# Patient Record
Sex: Female | Born: 1945 | Race: Black or African American | Hispanic: No | Smoking: Never smoker
Health system: Southern US, Community
[De-identification: ages and names within clinical notes are randomized; demographics above are authoritative.]

## PROBLEM LIST (undated history)

## (undated) DIAGNOSIS — J45909 Unspecified asthma, uncomplicated: Secondary | ICD-10-CM

## (undated) DIAGNOSIS — I509 Heart failure, unspecified: Secondary | ICD-10-CM

## (undated) HISTORY — PX: CHOLECYSTECTOMY: SHX55

## (undated) HISTORY — PX: OTHER SURGICAL HISTORY: SHX169

## (undated) HISTORY — PX: HERNIA REPAIR: SHX51

---

## 2015-07-12 ENCOUNTER — Emergency Department (HOSPITAL_COMMUNITY): Payer: Medicare Other

## 2015-07-12 ENCOUNTER — Encounter (HOSPITAL_COMMUNITY): Payer: Self-pay | Admitting: Emergency Medicine

## 2015-07-12 ENCOUNTER — Inpatient Hospital Stay (HOSPITAL_COMMUNITY)
Admission: EM | Admit: 2015-07-12 | Discharge: 2015-07-16 | DRG: 193 | Disposition: A | Discharge status: Home / Self Care | Payer: Medicare Other | Attending: Internal Medicine | Admitting: Internal Medicine

## 2015-07-12 DIAGNOSIS — R0902 Hypoxemia: Secondary | ICD-10-CM

## 2015-07-12 DIAGNOSIS — J101 Influenza due to other identified influenza virus with other respiratory manifestations: Secondary | ICD-10-CM | POA: Diagnosis present

## 2015-07-12 DIAGNOSIS — Z6841 Body Mass Index (BMI) 40.0 and over, adult: Secondary | ICD-10-CM

## 2015-07-12 DIAGNOSIS — I509 Heart failure, unspecified: Secondary | ICD-10-CM | POA: Diagnosis present

## 2015-07-12 DIAGNOSIS — Z86718 Personal history of other venous thrombosis and embolism: Secondary | ICD-10-CM

## 2015-07-12 DIAGNOSIS — J1100 Influenza due to unidentified influenza virus with unspecified type of pneumonia: Principal | ICD-10-CM | POA: Diagnosis present

## 2015-07-12 DIAGNOSIS — Z881 Allergy status to other antibiotic agents status: Secondary | ICD-10-CM | POA: Diagnosis not present

## 2015-07-12 DIAGNOSIS — Z7901 Long term (current) use of anticoagulants: Secondary | ICD-10-CM

## 2015-07-12 DIAGNOSIS — J9601 Acute respiratory failure with hypoxia: Secondary | ICD-10-CM | POA: Diagnosis present

## 2015-07-12 DIAGNOSIS — J45909 Unspecified asthma, uncomplicated: Secondary | ICD-10-CM | POA: Diagnosis present

## 2015-07-12 DIAGNOSIS — R8290 Unspecified abnormal findings in urine: Secondary | ICD-10-CM | POA: Diagnosis present

## 2015-07-12 DIAGNOSIS — R739 Hyperglycemia, unspecified: Secondary | ICD-10-CM | POA: Diagnosis present

## 2015-07-12 DIAGNOSIS — Z888 Allergy status to other drugs, medicaments and biological substances status: Secondary | ICD-10-CM | POA: Diagnosis not present

## 2015-07-12 DIAGNOSIS — R69 Illness, unspecified: Secondary | ICD-10-CM | POA: Diagnosis present

## 2015-07-12 DIAGNOSIS — Z8679 Personal history of other diseases of the circulatory system: Secondary | ICD-10-CM | POA: Diagnosis not present

## 2015-07-12 DIAGNOSIS — J9621 Acute and chronic respiratory failure with hypoxia: Secondary | ICD-10-CM | POA: Diagnosis present

## 2015-07-12 HISTORY — DX: Heart failure, unspecified: I50.9

## 2015-07-12 HISTORY — DX: Unspecified asthma, uncomplicated: J45.909

## 2015-07-12 LAB — I-STAT ARTERIAL BLOOD GAS, ED
Acid-Base Excess: 1 mmol/L (ref 0.0–2.0)
BICARBONATE: 26.6 meq/L — AB (ref 20.0–24.0)
O2 Saturation: 97 %
PH ART: 7.402 (ref 7.350–7.450)
PO2 ART: 86 mmHg (ref 80.0–100.0)
TCO2: 28 mmol/L (ref 0–100)
pCO2 arterial: 42.7 mmHg (ref 35.0–45.0)

## 2015-07-12 LAB — URINALYSIS, ROUTINE W REFLEX MICROSCOPIC
Bilirubin Urine: NEGATIVE
Glucose, UA: 500 mg/dL — AB
Ketones, ur: 15 mg/dL — AB
LEUKOCYTES UA: NEGATIVE
NITRITE: NEGATIVE
PH: 5 (ref 5.0–8.0)
Protein, ur: NEGATIVE mg/dL
SPECIFIC GRAVITY, URINE: 1.021 (ref 1.005–1.030)

## 2015-07-12 LAB — DIFFERENTIAL
BASOS ABS: 0.1 10*3/uL (ref 0.0–0.1)
BASOS PCT: 1 %
Eosinophils Absolute: 0 10*3/uL (ref 0.0–0.7)
Eosinophils Relative: 0 %
LYMPHS PCT: 9 %
Lymphs Abs: 1.5 10*3/uL (ref 0.7–4.0)
MONO ABS: 1 10*3/uL (ref 0.1–1.0)
MONOS PCT: 6 %
NEUTROS ABS: 14.1 10*3/uL — AB (ref 1.7–7.7)
Neutrophils Relative %: 84 %

## 2015-07-12 LAB — CBC
HCT: 41.1 % (ref 36.0–46.0)
Hemoglobin: 13.5 g/dL (ref 12.0–15.0)
MCH: 26.9 pg (ref 26.0–34.0)
MCHC: 32.8 g/dL (ref 30.0–36.0)
MCV: 82 fL (ref 78.0–100.0)
PLATELETS: 175 10*3/uL (ref 150–400)
RBC: 5.01 MIL/uL (ref 3.87–5.11)
RDW: 15.9 % — ABNORMAL HIGH (ref 11.5–15.5)
WBC: 16 10*3/uL — AB (ref 4.0–10.5)

## 2015-07-12 LAB — URINE MICROSCOPIC-ADD ON

## 2015-07-12 LAB — HEPATIC FUNCTION PANEL
ALBUMIN: 2.9 g/dL — AB (ref 3.5–5.0)
ALK PHOS: 96 U/L (ref 38–126)
ALT: 17 U/L (ref 14–54)
AST: 21 U/L (ref 15–41)
BILIRUBIN TOTAL: 0.8 mg/dL (ref 0.3–1.2)
Bilirubin, Direct: 0.2 mg/dL (ref 0.1–0.5)
Indirect Bilirubin: 0.6 mg/dL (ref 0.3–0.9)
Total Protein: 6.4 g/dL — ABNORMAL LOW (ref 6.5–8.1)

## 2015-07-12 LAB — BASIC METABOLIC PANEL
Anion gap: 12 (ref 5–15)
BUN: 10 mg/dL (ref 6–20)
CALCIUM: 8.9 mg/dL (ref 8.9–10.3)
CHLORIDE: 100 mmol/L — AB (ref 101–111)
CO2: 26 mmol/L (ref 22–32)
CREATININE: 0.85 mg/dL (ref 0.44–1.00)
Glucose, Bld: 270 mg/dL — ABNORMAL HIGH (ref 65–99)
Potassium: 3.9 mmol/L (ref 3.5–5.1)
SODIUM: 138 mmol/L (ref 135–145)

## 2015-07-12 LAB — LIPASE, BLOOD: LIPASE: 21 U/L (ref 11–51)

## 2015-07-12 LAB — TROPONIN I: Troponin I: <0.03 ng/mL (ref <0.031)

## 2015-07-12 LAB — INFLUENZA PANEL BY PCR (TYPE A & B)
H1N1 flu by pcr: NOT DETECTED
INFLBPCR: NEGATIVE
Influenza A By PCR: POSITIVE — AB

## 2015-07-12 LAB — I-STAT TROPONIN, ED: TROPONIN I, POC: 0 ng/mL (ref 0.00–0.08)

## 2015-07-12 LAB — PROTIME-INR
INR: 1.94 — AB (ref 0.00–1.49)
PROTHROMBIN TIME: 22.1 s — AB (ref 11.6–15.2)

## 2015-07-12 LAB — BRAIN NATRIURETIC PEPTIDE: B Natriuretic Peptide: 53.9 pg/mL (ref 0.0–100.0)

## 2015-07-12 LAB — GLUCOSE, CAPILLARY
GLUCOSE-CAPILLARY: 232 mg/dL — AB (ref 65–99)
GLUCOSE-CAPILLARY: 230 mg/dL — AB (ref 65–99)
GLUCOSE-CAPILLARY: 229 mg/dL — AB (ref 65–99)

## 2015-07-12 LAB — STREP PNEUMONIAE URINARY ANTIGEN: Strep Pneumo Urinary Antigen: NEGATIVE

## 2015-07-12 LAB — I-STAT CG4 LACTIC ACID, ED: LACTIC ACID, VENOUS: 1.82 mmol/L (ref 0.5–2.0)

## 2015-07-12 MED ORDER — SODIUM CHLORIDE 0.9 % IV BOLUS (SEPSIS): 500.0000 mL | INTRAVENOUS | Status: AC

## 2015-07-12 MED ORDER — DEXTROSE 5 % IV SOLN
500.0000 mg | Freq: Once | INTRAVENOUS | Status: AC
  Administered 2015-07-12: 500 mg via INTRAVENOUS
  Filled 2015-07-12: qty 500

## 2015-07-12 MED ORDER — MAGNESIUM SULFATE 2 GM/50ML IV SOLN: 2.0000 g | Freq: Once | INTRAVENOUS | Status: DC

## 2015-07-12 MED ORDER — ALBUTEROL (5 MG/ML) CONTINUOUS INHALATION SOLN: 10.0000 mg/h | INHALATION_SOLUTION | RESPIRATORY_TRACT | Status: DC

## 2015-07-12 MED ORDER — IPRATROPIUM BROMIDE 0.02 % IN SOLN: 1.0000 mg | Freq: Once | RESPIRATORY_TRACT | Status: DC

## 2015-07-12 MED ORDER — LEVETIRACETAM ER 500 MG PO TB24
1500.0000 mg | ORAL_TABLET | Freq: Every day | ORAL | Status: DC
  Administered 2015-07-12 – 2015-07-15 (×4): 1500 mg via ORAL
  Filled 2015-07-12 (×5): qty 3

## 2015-07-12 MED ORDER — IPRATROPIUM-ALBUTEROL 0.5-2.5 (3) MG/3ML IN SOLN
3.0000 mL | Freq: Once | RESPIRATORY_TRACT | Status: AC
  Administered 2015-07-12: 3 mL via RESPIRATORY_TRACT
  Filled 2015-07-12: qty 3

## 2015-07-12 MED ORDER — METHYLPREDNISOLONE SODIUM SUCC 125 MG IJ SOLR: 125.0000 mg | Freq: Once | INTRAMUSCULAR | Status: DC

## 2015-07-12 MED ORDER — SODIUM CHLORIDE 0.9 % IV SOLN
INTRAVENOUS | Status: DC
  Administered 2015-07-12: 17:00:00 via INTRAVENOUS

## 2015-07-12 MED ORDER — IPRATROPIUM-ALBUTEROL 0.5-2.5 (3) MG/3ML IN SOLN
3.0000 mL | RESPIRATORY_TRACT | Status: DC
  Administered 2015-07-12 – 2015-07-14 (×15): 3 mL via RESPIRATORY_TRACT
  Filled 2015-07-12 (×15): qty 3

## 2015-07-12 MED ORDER — AZITHROMYCIN 500 MG IV SOLR
500.0000 mg | INTRAVENOUS | Status: AC
  Administered 2015-07-13 – 2015-07-16 (×4): 500 mg via INTRAVENOUS
  Filled 2015-07-12 (×4): qty 500

## 2015-07-12 MED ORDER — IPRATROPIUM-ALBUTEROL 0.5-2.5 (3) MG/3ML IN SOLN: 3.0000 mL | Freq: Four times a day (QID) | RESPIRATORY_TRACT | Status: DC

## 2015-07-12 MED ORDER — OSELTAMIVIR PHOSPHATE 75 MG PO CAPS
75.0000 mg | ORAL_CAPSULE | Freq: Two times a day (BID) | ORAL | Status: DC
  Administered 2015-07-12 – 2015-07-16 (×9): 75 mg via ORAL
  Filled 2015-07-12 (×11): qty 1

## 2015-07-12 MED ORDER — ACETAMINOPHEN 325 MG PO TABS
650.0000 mg | ORAL_TABLET | Freq: Four times a day (QID) | ORAL | Status: DC | PRN
  Administered 2015-07-12 – 2015-07-15 (×5): 650 mg via ORAL
  Filled 2015-07-12 (×5): qty 2

## 2015-07-12 MED ORDER — ACETAMINOPHEN 500 MG PO TABS
1000.0000 mg | ORAL_TABLET | Freq: Once | ORAL | Status: AC
  Administered 2015-07-12: 1000 mg via ORAL
  Filled 2015-07-12: qty 2

## 2015-07-12 MED ORDER — METHYLPREDNISOLONE SODIUM SUCC 125 MG IJ SOLR
60.0000 mg | Freq: Four times a day (QID) | INTRAMUSCULAR | Status: DC
  Administered 2015-07-12 – 2015-07-16 (×16): 60 mg via INTRAVENOUS
  Filled 2015-07-12 (×16): qty 2

## 2015-07-12 MED ORDER — INSULIN ASPART 100 UNIT/ML ~~LOC~~ SOLN
0.0000 [IU] | SUBCUTANEOUS | Status: DC
  Administered 2015-07-12 (×3): 3 [IU] via SUBCUTANEOUS
  Administered 2015-07-13: 7 [IU] via SUBCUTANEOUS
  Administered 2015-07-13: 2 [IU] via SUBCUTANEOUS
  Administered 2015-07-13 (×2): 3 [IU] via SUBCUTANEOUS
  Administered 2015-07-13 (×3): 5 [IU] via SUBCUTANEOUS
  Administered 2015-07-14: 7 [IU] via SUBCUTANEOUS
  Administered 2015-07-14: 5 [IU] via SUBCUTANEOUS
  Administered 2015-07-14: 9 [IU] via SUBCUTANEOUS
  Administered 2015-07-14: 5 [IU] via SUBCUTANEOUS
  Administered 2015-07-14: 3 [IU] via SUBCUTANEOUS
  Administered 2015-07-15: 9 [IU] via SUBCUTANEOUS
  Administered 2015-07-15: 5 [IU] via SUBCUTANEOUS
  Administered 2015-07-15: 7 [IU] via SUBCUTANEOUS
  Administered 2015-07-15: 3 [IU] via SUBCUTANEOUS
  Administered 2015-07-15: 9 [IU] via SUBCUTANEOUS
  Administered 2015-07-15: 3 [IU] via SUBCUTANEOUS
  Administered 2015-07-16: 5 [IU] via SUBCUTANEOUS
  Administered 2015-07-16: 3 [IU] via SUBCUTANEOUS
  Administered 2015-07-16: 7 [IU] via SUBCUTANEOUS
  Administered 2015-07-16 (×2): 3 [IU] via SUBCUTANEOUS

## 2015-07-12 MED ORDER — WARFARIN SODIUM 6 MG PO TABS
6.0000 mg | ORAL_TABLET | Freq: Once | ORAL | Status: AC
  Administered 2015-07-12: 6 mg via ORAL
  Filled 2015-07-12 (×2): qty 1

## 2015-07-12 MED ORDER — DEXTROSE 5 % IV SOLN
1.0000 g | INTRAVENOUS | Status: AC
  Administered 2015-07-13 – 2015-07-16 (×4): 1 g via INTRAVENOUS
  Filled 2015-07-12 (×4): qty 10

## 2015-07-12 MED ORDER — MOMETASONE FURO-FORMOTEROL FUM 100-5 MCG/ACT IN AERO
2.0000 | INHALATION_SPRAY | Freq: Two times a day (BID) | RESPIRATORY_TRACT | Status: DC
  Administered 2015-07-12 – 2015-07-16 (×8): 2 via RESPIRATORY_TRACT
  Filled 2015-07-12: qty 8.8

## 2015-07-12 MED ORDER — SODIUM CHLORIDE 0.9 % IV BOLUS (SEPSIS)
1000.0000 mL | INTRAVENOUS | Status: AC
  Administered 2015-07-12 (×3): 1000 mL via INTRAVENOUS

## 2015-07-12 MED ORDER — DEXTROSE 5 % IV SOLN
1.0000 g | Freq: Once | INTRAVENOUS | Status: AC
  Administered 2015-07-12: 1 g via INTRAVENOUS
  Filled 2015-07-12: qty 10

## 2015-07-12 MED ORDER — WARFARIN - PHARMACIST DOSING INPATIENT
Freq: Every day | Status: DC
  Administered 2015-07-12 – 2015-07-13 (×2)

## 2015-07-12 NOTE — ED Notes (Signed)
EDP at bedside  

## 2015-07-12 NOTE — ED Notes (Signed)
Attempted report x1. 

## 2015-07-12 NOTE — Progress Notes (Signed)
UR COMPLETED  

## 2015-07-12 NOTE — ED Notes (Signed)
Pt placed in hospital bed for comfort.

## 2015-07-12 NOTE — ED Provider Notes (Signed)
CSN: [id number]     Arrival date & time 07/12/15  0631 History   First MD Initiated Contact with Patient 07/12/15 [phone]     Chief Complaint  Patient presents with  . Shortness of Breath     (Consider location/radiation/quality/duration/timing/severity/associated sxs/prior Treatment) HPI Shortness Of breath and cough starting yesterday. No chest pain. Patient was unaware she had a fever. Off productive of sputum. Lower extremity swelling chronic but more than usual. History of DVT, asthma, CHF. No increased lower extremity pain. Patient poor she takes Lasix and Coumadin. Reports she's been compliant with medications. No smoking history. No vomiting or diarrhea. No abdominal pain. Patient is from out of town for a conference. EMS report indicates patient's O2 sats in the field were in the mid 80s. He was brought to the ED with a nonrebreather mask. Past Medical History  Diagnosis Date  . CHF (congestive heart failure) (HCC)   . Asthma    No past surgical history on file. No family history on file. Social History  Substance Use Topics  . Smoking status: Not on file  . Smokeless tobacco: Not on file  . Alcohol Use: Not on file   OB History    No data available     Review of Systems 10 Systems reviewed and are negative for acute change except as noted in the HPI.    Allergies  Clindamycin/lincomycin and Smallpox vaccine  Home Medications   Prior to Admission medications   Medication Sig Start Date End Date Taking? Authorizing Provider  albuterol (PROVENTIL HFA;VENTOLIN HFA) 108 (90 Base) MCG/ACT inhaler Inhale 2 puffs into the lungs every 6 (six) hours as needed for wheezing or shortness of breath.   Yes Historical Provider, MD  Fluticasone-Salmeterol (ADVAIR) 250-50 MCG/DOSE AEPB Inhale 1 puff into the lungs 2 (two) times daily.   Yes Historical Provider, MD   BP 112/59 mmHg  Pulse 104  Temp(Src) 99 F (37.2 C) (Oral)  Resp 21  Ht  (1.575 m)  Wt 230 lb (104.327 kg)   BMI 42.06 kg/m2  SpO2 96% Physical Exam  Constitutional: She is oriented to person, place, and time.  Patient is alert. He has moderate respiratory distress. Morbidly obese.  HENT:  Head: Normocephalic and atraumatic.  Nose: Nose normal.  Slightly dry mucous membranes.  Eyes: EOM are normal. Pupils are equal, round, and reactive to light.  Neck: Neck supple.  Cardiovascular:  Tachycardia. Regular. No gross rub murmur gallop.  Pulmonary/Chest:  Moderate increased work of breathing. Wet cough. Crackles and rhonchi bilaterally.  Abdominal: Soft. Bowel sounds are normal. She exhibits no distension. There is no tenderness.  Musculoskeletal: Normal range of motion.  Patient has 2+ edema on the right lower extremity and 3+ edema left lower extremity.  Neurological: She is alert and oriented to person, place, and time. No cranial nerve deficit. She exhibits normal muscle tone. Coordination normal.  Skin: Skin is warm and dry.  Psychiatric: She has a normal mood and affect.    ED Course  Procedures (including critical care time) CRITICAL CARE Performed by: Arby Barrette   Total critical care time: 30 minutes  Critical care time was exclusive of separately billable procedures and treating other patients.  Critical care was necessary to treat or prevent imminent or life-threatening deterioration.  Critical care was time spent personally by me on the following activities: development of treatment plan with patient and/or surrogate as well as nursing, discussions with consultants, evaluation of patient's response to treatment, examination of  patient, obtaining history from patient or surrogate, ordering and performing treatments and interventions, ordering and review of laboratory studies, ordering and review of radiographic studies, pulse oximetry and re-evaluation of patient's condition. Labs Review Labs Reviewed  BASIC METABOLIC PANEL - Abnormal; Notable for the following:    Chloride  100 (*)    Glucose, Bld 270 (*)    All other components within normal limits  CBC - Abnormal; Notable for the following:    WBC 16.0 (*)    RDW 15.9 (*)    All other components within normal limits  PROTIME-INR - Abnormal; Notable for the following:    Prothrombin Time 22.1 (*)    INR 1.94 (*)    All other components within normal limits  URINALYSIS, ROUTINE W REFLEX MICROSCOPIC (NOT AT [facility]) - Abnormal; Notable for the following:    APPearance HAZY (*)    Glucose, UA 500 (*)    Hgb urine dipstick MODERATE (*)    Ketones, ur 15 (*)    All other components within normal limits  HEPATIC FUNCTION PANEL - Abnormal; Notable for the following:    Total Protein 6.4 (*)    Albumin 2.9 (*)    All other components within normal limits  INFLUENZA PANEL BY PCR (TYPE A & B, H1N1) - Abnormal; Notable for the following:    Influenza A By PCR POSITIVE (*)    All other components within normal limits  DIFFERENTIAL - Abnormal; Notable for the following:    Neutro Abs 14.1 (*)    All other components within normal limits  URINE MICROSCOPIC-ADD ON - Abnormal; Notable for the following:    Squamous Epithelial / LPF 0-5 (*)    Bacteria, UA MANY (*)    All other components within normal limits  I-STAT ARTERIAL BLOOD GAS, ED - Abnormal; Notable for the following:    Bicarbonate 26.6 (*)    All other components within normal limits  CULTURE, BLOOD (ROUTINE X 2)  CULTURE, BLOOD (ROUTINE X 2)  URINE CULTURE  BRAIN NATRIURETIC PEPTIDE  TROPONIN I  LIPASE, BLOOD  I-STAT TROPOININ, ED  I-STAT CG4 LACTIC ACID, ED    Imaging Review Dg Chest Port 1 View  07/12/2015  CLINICAL DATA:  Shortness of breath for 2 days EXAM: PORTABLE CHEST 1 VIEW COMPARISON:  None. FINDINGS: Cardiac shadow is at the upper limits of normal in size. Mild aortic calcifications are seen. Elevation of the right hemidiaphragm is noted. No focal infiltrate or sizable effusion is seen. No acute bony abnormality is noted. IMPRESSION: No  acute abnormality noted. Electronically Signed   By: Alcide Clever M.D.   On: 07/12/2015 07:39   I have personally reviewed and evaluated these images and lab results as part of my medical decision-making.   EKG Interpretation   Date/Time:  Thursday July 12 2015 06:38:53 EST Ventricular Rate:  111 PR Interval:  129 QRS Duration: 78 QT Interval:  352 QTC Calculation: 478 R Axis:   41 Text Interpretation:  Sinus tachycardia Ventricular premature complex Low  voltage, extremity and precordial leads agree. no STEMI. Confirmed by  Donnald Garre, MD, Lebron Conners [phone]) on 07/12/2015 8:07:45 AM     Recheck 11:30. Patient reports some subjective improvement. Continues mild/moderate resp distress. Speaking full sentences, clear MS. O2 96% 4L [place]. Will repeat duoneb.  Consult to [facility] for admission.  MDM   Final diagnoses:  Influenza A  Hypoxia  Severe comorbid illness   She has had fairly rapid onset of dyspnea. She  has associated fever and tests positive for influenza A. She presented with moderate respiratory distress. She has improved with DuoNeb and supplemental oxygen. With recheck she continues to have tachypnea but is alert and appropriate with no mental status change and no imminent respiratory failure. The patient's comorbid illness, hypoxia and respiratory distress I felt she needed hospital admission with continued treatment and observation. A she is high risk for complications of influenza.    Arby Barrette, MD 07/12/15 [phone]

## 2015-07-12 NOTE — H&P (Signed)
[facility] History and Physical                                                                                    Gabriella Hudson, is a 70 y.o. female  MRN: [id number]   DOB - 1946/03/05  Admit Date - 07/12/2015  Outpatient Primary MD for the patient is UNASSIGNED (PCP is in [place], [place])  Referring MD: Donnald Garre / ER  PMH: Past Medical History  Diagnosis Date  . DVT on WAFARIN   . Asthma       PSH: No past surgical history on file.   CC:  Chief Complaint  Patient presents with  . Shortness of Breath     HPI: 70 year old female patient with ongoing asthma onset as adult, remote history of DVT on Coumadin who presents to the ER with shortness of breath. Patient is in [place] with her church for a found her stay celebration. She reports that beginning Tuesday night she developed cough with clear productive sputum and some wheezing and shortness of breath which she attributed to her asthma and felt she was having an asthma exacerbation. By Wednesday patient began feeling more weak and was having chills but was unaware she was having fevers. He was also experiencing myalgias. No nausea vomiting or headache. She reports that one of her fellow church members traveling with them had been sick recently and as of Sunday was still running low-grade temperatures of 99. She also reports that last week her son had been cooking outside with the wound and going in and out of her home allowing smoked in her home and she felt this also aggravated her asthma. Patient has allergies to smallpox polio as well as influenza vaccine so does not take immunizations. EMS was called to the [facility] where the patient was staying in based on clinical presentation transported the patient to the hospital for further evaluation.  ER Evaluation and treatment: Initial temperature 99.7 with MAXIMUM TEMPERATURE 101.7F in the ER, BP 114/42, pulse 105, respirations 32, initial O2 sats on room air were not  documented although EMS place patient on 100% nonrebreather mask for transportation and she is subsequently been weaned to 4 L/m. PCXR: No acute abnormality a portable radiologist, she does have interstitial changes that appear to be consistent with pneumonitis noting no previous x-rays for comparison EKG: Sinus tachycardia ventricular rate 111 bpm, QTC 4 and 70 ms, no acute ST segment or T-wave changes that would be concerning for ischemia Laboratory data: Sodium 138, potassium 3.9, BUN 10 and creatinine 0.5, glucose 270, albumin 2.9, BNP 53.9, point-of-care troponin 0.00, troponin less than 0.3, lactic acid 1.82, WBCs 16,000 with neutrophils 84% and absolute neutrophils 14.1%, hemoglobin 13.5 platelets 175,000, PT 22.1 INR 1.94; influenza a positive by PCR, abnormal urinalysis with hazy appearance many bacteria 500 glucose moderate hemoglobin 15 ketones 0-5 wbc's, blood cultures as well as urine culture has been obtained Rocephin 1 g IV 1 dose, Zithromax 5 mg IV 1 dose 3 L normal saline IV DuoNeb 2 doses Tylenol thousand milligrams 1   Review of Systems   In addition to the HPI above,  No Headache, changes with Vision  or hearing, new weakness, tingling, numbness in any extremity, No problems swallowing food or Liquids, indigestion/reflux No Chest pain, palpitations, orthopnea  No Abdominal pain, N/V; no melena or hematochezia, no dark tarry stools, Bowel movements are regular, No dysuria, hematuria or flank pain No new skin rashes, lesions, masses or bruises, No new joints pains-aches No recent weight gain or loss No polyuria, polydypsia or polyphagia,  *A full 10 point Review of Systems was done, except as stated above, all other Review of Systems were negative.  Social History Social History  Substance Use Topics  . Smoking status: Not on file  . Smokeless tobacco: Not on file  . Alcohol Use: Not on file    Resides at: Private residence  Lives with: Alone but son available  to assist  Ambulatory status: Rolling walker   Family History No family history on file.   Prior to Admission medications   Medication Sig Start Date End Date Taking? Authorizing Provider  albuterol (PROVENTIL HFA;VENTOLIN HFA) 108 (90 Base) MCG/ACT inhaler Inhale 2 puffs into the lungs every 6 (six) hours as needed for wheezing or shortness of breath.   Yes Historical Provider, MD  Fluticasone-Salmeterol (ADVAIR) 250-50 MCG/DOSE AEPB Inhale 1 puff into the lungs 2 (two) times daily.   Yes Historical Provider, MD  warfarin (COUMADIN) 4 MG tablet Take 4 mg by mouth daily.   Yes Historical Provider, MD    Allergies  Allergen Reactions  . Clindamycin/Lincomycin Hives  . Smallpox Vaccine Hives    Physical Exam  Vitals  Blood pressure 100/58, pulse 109, temperature 99 F (37.2 C), temperature source Oral, resp. rate 21, height 5\' 2"  (1.575 m), weight 230 lb (104.327 kg), SpO2 95 %.   General:  In mild distress as evidenced by ongoing increased work of breathing; appears stated age-short of breath with phonation  Psych:  Normal affect, Denies Suicidal or Homicidal ideations, Awake Alert, Oriented X 3. Speech and thought patterns are clear and appropriate, no apparent short term memory deficits  Neuro:   No focal neurological deficits, CN II through XII intact, Strength 5/5 all 4 extremities, Sensation intact all 4 extremities.  ENT:  Ears and Eyes appear Normal, Conjunctivae clear, PER. Moist oral mucosa without erythema or exudates.  Neck:  Supple, No lymphadenopathy appreciated  Respiratory:  Symmetrical chest wall movement, Good air movement bilaterally although somewhat decreased in the bases, few crackles without wheezing. 4 L oxygen  Cardiac:  RRR, No Murmurs, chronic stable bilateral LE edema noted, no JVD, No carotid bruits, peripheral pulses palpable at 2+  Abdomen:  Positive bowel sounds, Soft, Non tender, Non distended,  No masses appreciated, no obvious  hepatosplenomegaly  Skin:  No Cyanosis, Normal Skin Turgor, No Skin Rash or Bruise.  Extremities: Symmetrical without obvious trauma or injury,  no effusions.  Data Review  CBC  Recent Labs Lab 07/12/15 0649 07/12/15 0759  WBC 16.0*  --   HGB 13.5  --   HCT 41.1  --   PLT 175  --   MCV 82.0  --   MCH 26.9  --   MCHC 32.8  --   RDW 15.9*  --   LYMPHSABS  --  1.5  MONOABS  --  1.0  EOSABS  --  0.0  BASOSABS  --  0.1    Chemistries   Recent Labs Lab 07/12/15 0649 07/12/15 0759  NA 138  --   K 3.9  --   CL 100*  --   CO2 26  --  GLUCOSE 270*  --   BUN 10  --   CREATININE 0.85  --   CALCIUM 8.9  --   AST  --  21  ALT  --  17  ALKPHOS  --  96  BILITOT  --  0.8    estimated creatinine clearance is 69.8 mL/min (by C-G formula based on Cr of 0.85).  No results for input(s): TSH, T4TOTAL, T3FREE, THYROIDAB in the last 72 hours.  Invalid input(s): FREET3  Coagulation profile  Recent Labs Lab 07/12/15 0649  INR 1.94*    No results for input(s): DDIMER in the last 72 hours.  Cardiac Enzymes  Recent Labs Lab 07/12/15 0759  TROPONINI <0.03    Invalid input(s): POCBNP  Urinalysis    Component Value Date/Time   COLORURINE YELLOW 07/12/2015 0915   APPEARANCEUR HAZY* 07/12/2015 0915   LABSPEC 1.021 07/12/2015 0915   PHURINE 5.0 07/12/2015 0915   GLUCOSEU 500* 07/12/2015 0915   HGBUR MODERATE* 07/12/2015 0915   BILIRUBINUR NEGATIVE 07/12/2015 0915   KETONESUR 15* 07/12/2015 0915   PROTEINUR NEGATIVE 07/12/2015 0915   NITRITE NEGATIVE 07/12/2015 0915   LEUKOCYTESUR NEGATIVE 07/12/2015 0915    Imaging results:   Dg Chest Port 1 View  07/12/2015  CLINICAL DATA:  Shortness of breath for 2 days EXAM: PORTABLE CHEST 1 VIEW COMPARISON:  None. FINDINGS: Cardiac shadow is at the upper limits of normal in size. Mild aortic calcifications are seen. Elevation of the right hemidiaphragm is noted. No focal infiltrate or sizable effusion is seen. No acute  bony abnormality is noted. IMPRESSION: No acute abnormality noted. Electronically Signed   By: Alcide Clever M.D.   On: 07/12/2015 07:39     EKG: (Independently reviewed) No acute abnormality a portable radiologist, she does have interstitial changes that appear to be consistent with pneumonitis noting no previous x-rays for comparison   Assessment & Plan  Principal Problem:   Acute respiratory failure with hypoxia:   A) Influenza A   B) Asthma -Admit to SDU/Inpt -Increased work of breathing off 100% nonrebreather so repeat ABG -Continue supportive care with oxygen -With underlying asthma add Solu-Medrol and scheduled DuoNeb -Begin Tamiflu -Empiric Rocephin and Zithromax as prophylaxis for secondary community acquired pneumonia -Follow up on blood cultures -Obtain sputum culture, urinary strep  Active Problems:   Acute hyperglycemia -Had not received any prednisone prior to elevated glucose -Chek HgbA1c -Follow CBGs/SSI    Abnormal urinalysis -Follow up on urine culture -Above prescribed Rocephin will cover it has UTI    History of DVT (deep vein thrombosis) -Continue preadmission Coumadin with pharmacy dosing    DVT Prophylaxis: Warfarin  Family Communication: No family at bedside    Code Status:  Full code  Condition:  Guarded  Discharge disposition: When medically stable anticipate discharge back to previous home environment  Time spent in minutes : 60      Alauna Hayden L. ANP on 07/12/2015 at 12:09 PM  You may contact me by going to [email or web address] - password [email or web address]  I am available from 7a-7p but please confirm I am on the schedule by going to Amion as above.   After 7p please contact night coverage person covering me after hours  [facility]

## 2015-07-12 NOTE — ED Notes (Signed)
Pt taken off of NRB and placed on [place] at 4L. SpO2 remained at 100%.

## 2015-07-12 NOTE — Care Management Note (Addendum)
Case Management Note  Patient Details  Name: Brenya Taulbee MRN: [id number] Date of Birth: [birth year]  Subjective/Objective:         Admitted with SOB. Hx of DVT(coumadin), Asthma, CHF.From home with son. Independent with ADL's  PTA. Pt owns walker. PCP: Dr. Eligha Bridegroom.        Action/Plan: Return to home when medically stable. CM to f/u with disposition needs.  Expected Discharge Date:                  Expected Discharge Plan:  Home/Self Care  In-House Referral:     Discharge planning Services  CM Consult  Post Acute Care Choice:    Choice offered to:     DME Arranged:    DME Agency:     HH Arranged:    HH Agency:     Status of Service:  In process, will continue to follow  Medicare Important Message Given:    Date Medicare IM Given:    Medicare IM give by:    Date Additional Medicare IM Given:    Additional Medicare Important Message give by:     If discussed at Long Length of Stay Meetings, dates discussed:    Additional Comments: Ebbie Latus [facility])  [phone]  Gae Gallop [place], [place] [phone] 07/12/2015, 7:51 PM

## 2015-07-12 NOTE — Progress Notes (Addendum)
After arrival to the floor, patient evaluated by my attending. Patient status has stabilized and her respiratory symptoms have improved therefore transfer to stepdown unit canceled as well as ABG.  Junious Silk, ANP

## 2015-07-12 NOTE — Significant Event (Signed)
Rapid Response Event Note  Overview: Time Called: 1659 Arrival Time: 1705 Event Type: Respiratory  Initial Focused Assessment:  Called by primary RN for a "second set of eyes".  Upon my arrival to patients room, RT at bedside instructing patient on use of flutter valve.  Patient sitting in bed with nasal cannula at 3 lpm, with mild SOB.  Patient states she feels slightly better than when she came in, denies feeling SOB or having pain.   Interventions:  VS 134/66, HR 97, RR 22-24, Sats 98%, Temp oral 98.8.  Patient able to cough up small amount of sputum after use of flutter valve.  Breath sounds, Rhonchi bilaterally, with fine crackles.  Skin warm and dry, b/l leg edema 3+ non pitting, patient states this is chronic however edema seems a little worse today.    Event Summary: RN to call if assistance neeede    at      at          [facility], Maryagnes Amos

## 2015-07-12 NOTE — Consult Note (Signed)
ANTICOAGULATION CONSULT NOTE - Initial Consult  Pharmacy Consult for Coumadin Indication: hx DVT  Allergies  Allergen Reactions  . Clindamycin/Lincomycin Hives  . Smallpox Vaccine Hives    Patient Measurements: Height:  (157.5 cm) Weight: 230 lb (104.327 kg) IBW/kg (Calculated) : 50.1  Vital Signs: Temp: 99 F (37.2 C) (02/09 0914) Temp Source: Oral (02/09 0914) BP: 100/58 mmHg (02/09 1130) Pulse Rate: 109 (02/09 1130)  Labs:  Recent Labs  07/12/15 0649 07/12/15 0759  HGB 13.5  --   HCT 41.1  --   PLT 175  --   LABPROT 22.1*  --   INR 1.94*  --   CREATININE 0.85  --   TROPONINI  --  <0.03    Estimated Creatinine Clearance: 69.8 mL/min (by C-G formula based on Cr of 0.85).   Medical History: Past Medical History  Diagnosis Date  . CHF (congestive heart failure) (HCC)   . Asthma    Assessment: 69yof on coumadin for hx DVT. INR on admission slightly below goal at 1.94. Coumadin to continue.  Home dose:  daily - last taken 2/8  Goal of Therapy:  INR 2-3 Monitor platelets by anticoagulation protocol: Yes   Plan:  1) Coumadin  x 1 tonight 2) Daily INR  Fredrik Rigger 07/12/2015,11:53 AM

## 2015-07-12 NOTE — ED Notes (Addendum)
Pt in EMS from [employer], here for conference. Pt started having SOB last night, tried at home inhaler with no relief has continually gotten worse. Per EMS pt also had near snycopal episode. Hx CHF, asthma, DVT. Denies CP.

## 2015-07-13 DIAGNOSIS — J9601 Acute respiratory failure with hypoxia: Secondary | ICD-10-CM

## 2015-07-13 LAB — CBC
HCT: 40.3 % (ref 36.0–46.0)
HEMOGLOBIN: 12.4 g/dL (ref 12.0–15.0)
MCH: 25.6 pg — AB (ref 26.0–34.0)
MCHC: 30.8 g/dL (ref 30.0–36.0)
MCV: 83.1 fL (ref 78.0–100.0)
PLATELETS: 184 10*3/uL (ref 150–400)
RBC: 4.85 MIL/uL (ref 3.87–5.11)
RDW: 16.4 % — AB (ref 11.5–15.5)
WBC: 21.1 10*3/uL — ABNORMAL HIGH (ref 4.0–10.5)

## 2015-07-13 LAB — COMPREHENSIVE METABOLIC PANEL
ALBUMIN: 2.3 g/dL — AB (ref 3.5–5.0)
ALK PHOS: 75 U/L (ref 38–126)
ALT: 14 U/L (ref 14–54)
AST: 18 U/L (ref 15–41)
Anion gap: 6 (ref 5–15)
BILIRUBIN TOTAL: 0.3 mg/dL (ref 0.3–1.2)
BUN: 10 mg/dL (ref 6–20)
CALCIUM: 9.2 mg/dL (ref 8.9–10.3)
CO2: 29 mmol/L (ref 22–32)
CREATININE: 0.66 mg/dL (ref 0.44–1.00)
Chloride: 106 mmol/L (ref 101–111)
GFR calc Af Amer: >60 mL/min (ref >60)
GFR calc non Af Amer: >60 mL/min (ref >60)
GLUCOSE: 202 mg/dL — AB (ref 65–99)
Potassium: 3.9 mmol/L (ref 3.5–5.1)
SODIUM: 141 mmol/L (ref 135–145)
TOTAL PROTEIN: 5.9 g/dL — AB (ref 6.5–8.1)

## 2015-07-13 LAB — PROTIME-INR
INR: 1.94 — ABNORMAL HIGH (ref 0.00–1.49)
Prothrombin Time: 22 seconds — ABNORMAL HIGH (ref 11.6–15.2)

## 2015-07-13 LAB — URINE CULTURE

## 2015-07-13 LAB — GLUCOSE, CAPILLARY
GLUCOSE-CAPILLARY: 237 mg/dL — AB (ref 65–99)
GLUCOSE-CAPILLARY: 277 mg/dL — AB (ref 65–99)
GLUCOSE-CAPILLARY: 283 mg/dL — AB (ref 65–99)
GLUCOSE-CAPILLARY: 339 mg/dL — AB (ref 65–99)
Glucose-Capillary: 182 mg/dL — ABNORMAL HIGH (ref 65–99)
Glucose-Capillary: 204 mg/dL — ABNORMAL HIGH (ref 65–99)

## 2015-07-13 LAB — HEMOGLOBIN A1C
HEMOGLOBIN A1C: 9.5 % — AB (ref 4.8–5.6)
MEAN PLASMA GLUCOSE: 226 mg/dL

## 2015-07-13 LAB — HIV ANTIBODY (ROUTINE TESTING W REFLEX): HIV SCREEN 4TH GENERATION: NONREACTIVE

## 2015-07-13 MED ORDER — LIVING WELL WITH DIABETES BOOK
Freq: Once | Status: AC
  Administered 2015-07-13: 13:00:00
  Filled 2015-07-13: qty 1

## 2015-07-13 MED ORDER — WARFARIN SODIUM 6 MG PO TABS
6.0000 mg | ORAL_TABLET | Freq: Once | ORAL | Status: AC
  Administered 2015-07-13: 6 mg via ORAL
  Filled 2015-07-13: qty 1

## 2015-07-13 MED ORDER — FUROSEMIDE 10 MG/ML IJ SOLN
20.0000 mg | Freq: Once | INTRAMUSCULAR | Status: AC
  Administered 2015-07-13: 20 mg via INTRAVENOUS
  Filled 2015-07-13: qty 2

## 2015-07-13 NOTE — Progress Notes (Signed)
Spoke with patient about new diabetes diagnosis.  Patient states that she has been told she had "borderline diabetes" and gets elevated sugars when she has to have steroids. Patient states that she has gotten steroid injections in her knees this past Monday and also in December 2016.  Discussed A1C results (9.5% on 07/12/2015) and explained what an A1C is and informed patient that his current A1C indicates an average glucose of 226 mg/dl over the past 2-3 months. Explained that steroid injections have likely contributed to elevated A1C. Patient states that she has to get injections in her knees about every 3 months and her doctor has instructed her to take an oral DM medication when she has the steroids injections. Patient could not remember the name of the medication but stated that she gets it filled at [facility] in [place], [place]. Patient states that she was on Metformin but was unable to take it due to GI intolerance and so her doctor prescribed her something different. Called [facility] and was told that patient was prescribed Glyburide 10 mg BID and last had it filled in January 2016.  Patient states that she has a glucometer at home and she periodically checks her glucose. She states that her glucose was 125 mg/dl one day early this week when she checked her glucose at home. Discussed basic pathophysiology of DM Type 2, basic home care, importance of checking CBGs and maintaining good CBG control to prevent long-term and short-term complications. Reviewed glucose and A1C goals and explained that patient will need to continue to  Reviewed signs and symptoms of hyperglycemia and hypoglycemia along with treatment for both. Patient states that she has a history of having hypoglycemia when her glucose gets less than 100 mg/dl. Patient reports that she has a fear of hypoglycemia because she nearly passed out once in the past and does not want to experience a low glucose. Patient expressed concern about being given insulin here in  the hospital. Discussed Novolog insulin and explained that it usually has a duration of 6-8 hours. Discussed impact of nutrition, exercise, stress, sickness, and medications on diabetes control. Reviewed Living Well with diabetes booklet and encouraged patient to read through entire book. Patient questioned what medications she would be on at discharge for diabetes. Informed patient that MD has not indicated discharge plan for glycemic control at this time. Patient again expressed concern about having hypoglycemia. Encouraged patient to check her glucose 3-4 times per day if that would ease her mind about her glucose getting too low. Also encouraged patient to contact her PCP if she experiences any glucose values less than 70 mg/dl to get advice on DM medication adjustments.   Patient verbalized understanding of information discussed and she states that she has no further questions at this time related to diabetes.   RNs to provide ongoing basic DM education at bedside with this patient.   Thanks, Orlando Penner, RN, MSN, CDE Diabetes Coordinator Inpatient Diabetes Program [phone] (Team Pager from 8am to 5pm) [phone] ([place] office) [phone] [facility] [place] office) [phone] [facility] office)

## 2015-07-13 NOTE — Consult Note (Signed)
ANTICOAGULATION CONSULT NOTE  Pharmacy Consult for Coumadin Indication: hx DVT  Allergies  Allergen Reactions  . Clindamycin/Lincomycin Hives  . Pneumococcal Vaccines Other (See Comments)    Left scar   . Smallpox Vaccine Hives    Patient Measurements: Height:  (167.6 cm) Weight: 197 lb 1.5 oz (89.4 kg) IBW/kg (Calculated) : 59.3  Vital Signs: Temp: 97.5 F (36.4 C) (02/10 0457) Temp Source: Oral (02/10 0457) BP: 124/69 mmHg (02/10 0457) Pulse Rate: 76 (02/10 0457)  Labs:  Recent Labs  07/12/15 0649 07/12/15 0759 07/13/15 0635  HGB 13.5  --  12.4  HCT 41.1  --  40.3  PLT 175  --  184  LABPROT 22.1*  --  22.0*  INR 1.94*  --  1.94*  CREATININE 0.85  --   --   TROPONINI  --  <0.03  --     Estimated Creatinine Clearance: 69.3 mL/min (by C-G formula based on Cr of 0.85).   Medical History: Past Medical History  Diagnosis Date  . CHF (congestive heart failure) (HCC)   . Asthma    Assessment: Gabriella Hudson on coumadin for hx DVT. INR remains slightly below goal at 1.94. Coumadin to continue. CBC wnl. No bleed documented.  Home dose:  daily - last taken 2/8  Goal of Therapy:  INR 2-3 Monitor platelets by anticoagulation protocol: Yes   Plan:  Coumadin  x 1 Daily INR Mon s/sx bleeding  Babs Bertin, PharmD, [facility] Clinical Pharmacist Pager [phone] 07/13/2015 8:57 AM

## 2015-07-13 NOTE — Progress Notes (Signed)
Inpatient Diabetes Program Recommendations  AACE/ADA: New Consensus Statement on Inpatient Glycemic Control (2015)  Target Ranges:  Prepandial:   less than 140 mg/dL      Peak postprandial:   less than 180 mg/dL (1-2 hours)      Critically ill patients:  140 - 180 mg/dL  Results for Gabriella Hudson, Gabriella Hudson (MRN [id number]) as of 07/13/2015 12:46  Ref. Range 07/12/2015 12:44 07/12/2015 17:11 07/12/2015 20:42 07/13/2015 00:45 07/13/2015 04:33 07/13/2015 07:59 07/13/2015 12:41  Glucose-Capillary Latest Ref Range: 65-99 mg/dL 829 (H) 562 (H) 130 (H) 237 (H) 182 (H) 204 (H) 283 (H)   Review of Glycemic Control  Diabetes history: No Outpatient Diabetes medications: NA Current orders for Inpatient glycemic control: Novolog 0-9 units Q4H  Inpatient Diabetes Program Recommendations: Insulin - Basal: Patient has been receiving steroids and Novolog correction Q4H. Glucose has ranged from 182-237 mg/dl over the past 24 hours.  Please consider ordering low dose basal insulin; recommend starting with Lantus 9 units Q24H (based on 89 kg x 0.1 units). HgbA1C: A1C was 9.5% on 07/12/2015 indicating an average glucose of 226 mg/dl.  A1C of 9.5% meets ADA criteria for DM dx.   MD, please inform patient of DM dx and then inform NURSING of DM dx so patient can be educated.  NURSING: Once patient is informed of DM dx, she will need to be educated on diabetes. Have ordered Living Well With Diabetes booklet and patient education.   Thanks, Orlando Penner, RN, MSN, CDE Diabetes Coordinator Inpatient Diabetes Program [phone] (Team Pager from 8am to 5pm) [phone] ([place] office) [phone] [facility] office) [phone] [facility] office)

## 2015-07-13 NOTE — Progress Notes (Signed)
PROGRESS NOTE  Gabriella Hudson WUJ:[id number] DOB: June 23, 1945 DOA: 07/12/2015 PCP: No primary care provider on file.   HPI: 70 year old female patient with ongoing asthma onset as adult, remote history of DVT on Coumadin who presents to the ER with shortness of breath, was hypoxic on admission, and she was found to have influenza.  Subjective / 24 H Interval events - Patient feels improved this morning, however still endorses shortness of breath when she moves around  Assessment/Plan: Principal Problem:   Acute respiratory failure with hypoxia (HCC) Active Problems:   Influenza A   History of DVT (deep vein thrombosis)   Acute hyperglycemia   Asthma   Abnormal urinalysis   Acute on chronic respiratory failure with hypoxia (HCC)   Acute respiratory failure with hypoxia due to Influenza A and asthma exacerbation - Patient improving with antibiotics, antivirals, breathing treatments and steroids. Continue current management, provide oxygen as needed and at the same time wean off as tolerated. - Cultures negative so far  Type 2 diabetes mellitus - Discussed with the patient today, she states that she is been having hyperglycemia when she gets steroids, this happen several times a year, has not had any steroids in the last 3 months other than knee injections - Hemoglobin A1c is elevated at 9.5, Suggesting true diabetes rather than just occasional hyperglycemia in the setting of steroids - Diabetes educator consulted, appreciate input  Abnormal urinalysis - Follow up on urine culture - She is on ceftriaxone for upper respiratory infection, should cover  History of DVT (deep vein thrombosis) - Continue preadmission Coumadin with pharmacy dosing  Lower extremity swelling - Mild, patient asking for Lasix, she has normal renal function, will provide 20 mg IV  Diet: Diet Heart Room service appropriate?: Yes; Fluid consistency:: Thin Fluids: None  DVT Prophylaxis: Coumadin  Code  Status: Full Code Family Communication: no family bedside  Disposition Plan: home when ready  Barriers to discharge: hypoxia, IV therapies  Consultants:  None   Procedures:  None    Antibiotics Azithromycin 2/9>> Ceftriaxone 2/9>> Tamiflu 2/9>>   Studies  Dg Chest Port 1 View  07/12/2015  CLINICAL DATA:  Shortness of breath for 2 days EXAM: PORTABLE CHEST 1 VIEW COMPARISON:  None. FINDINGS: Cardiac shadow is at the upper limits of normal in size. Mild aortic calcifications are seen. Elevation of the right hemidiaphragm is noted. No focal infiltrate or sizable effusion is seen. No acute bony abnormality is noted. IMPRESSION: No acute abnormality noted. Electronically Signed   By: Alcide Clever M.D.   On: 07/12/2015 07:39    Objective  Filed Vitals:   07/13/15 0423 07/13/15 0457 07/13/15 0842 07/13/15 1112  BP:  124/69    Pulse:  76    Temp:  97.5 F (36.4 C)    TempSrc:  Oral    Resp:  20    Height:      Weight:      SpO2: 99% 97% 95% 96%    Intake/Output Summary (Last 24 hours) at 07/13/15 1356 Last data filed at 07/13/15 1305  Gross per 24 hour  Intake    222 ml  Output      0 ml  Net    222 ml   Filed Weights   07/12/15 0638 07/12/15 1249  Weight: 104.327 kg (230 lb) 89.4 kg (197 lb 1.5 oz)    Exam:  GENERAL: NAD  HEENT: no scleral icterus, PERRL  NECK: supple, no LAD  LUNGS: minimal wheezing bilateral lung fields,  moving air well   HEART: RRR without MRG, trace lower extremity edema   ABDOMEN: soft, non tender  EXTREMITIES: no clubbing / cyanosis  NEUROLOGIC: non focal  Data Reviewed: Basic Metabolic Panel:  Recent Labs Lab 07/12/15 0649 07/13/15 0635  NA 138 141  K 3.9 3.9  CL 100* 106  CO2 26 29  GLUCOSE 270* 202*  BUN 10 10  CREATININE 0.85 0.66  CALCIUM 8.9 9.2   Liver Function Tests:  Recent Labs Lab 07/12/15 0759 07/13/15 0635  AST 21 18  ALT 17 14  ALKPHOS 96 75  BILITOT 0.8 0.3  PROT 6.4* 5.9*  ALBUMIN 2.9*  2.3*    Recent Labs Lab 07/12/15 0759  LIPASE 21   CBC:  Recent Labs Lab 07/12/15 0649 07/12/15 0759 07/13/15 0635  WBC 16.0*  --  21.1*  NEUTROABS  --  14.1*  --   HGB 13.5  --  12.4  HCT 41.1  --  40.3  MCV 82.0  --  83.1  PLT 175  --  184   Cardiac Enzymes:  Recent Labs Lab 07/12/15 0759  TROPONINI <0.03   BNP (last 3 results)  Recent Labs  07/12/15 0649  BNP 53.9   CBG:  Recent Labs Lab 07/12/15 2042 07/13/15 0045 07/13/15 0433 07/13/15 0759 07/13/15 1241  GLUCAP 229* 237* 182* 204* 283*    Recent Results (from the past 240 hour(s))  Blood Culture (routine x 2)     Status: None (Preliminary result)   Collection Time: 07/12/15  7:35 AM  Result Value Ref Range Status   Specimen Description BLOOD LEFT FINGER  Final   Special Requests IN PEDIATRIC BOTTLE 4CCS  Final   Culture NO GROWTH 1 DAY  Final   Report Status PENDING  Incomplete  Blood Culture (routine x 2)     Status: None (Preliminary result)   Collection Time: 07/12/15  7:50 AM  Result Value Ref Range Status   Specimen Description BLOOD LEFT WRIST  Final   Special Requests IN PEDIATRIC BOTTLE 5CCS  Final   Culture NO GROWTH 1 DAY  Final   Report Status PENDING  Incomplete     Scheduled Meds: . azithromycin  500 mg Intravenous Q24H  . cefTRIAXone (ROCEPHIN)  IV  1 g Intravenous Q24H  . furosemide  20 mg Intravenous Once  . insulin aspart  0-9 Units Subcutaneous 6 times per day  . ipratropium-albuterol  3 mL Nebulization Q4H  . levETIRAcetam  1,500 mg Oral QHS  . living well with diabetes book   Does not apply Once  . methylPREDNISolone (SOLU-MEDROL) injection  60 mg Intravenous Q6H  . mometasone-formoterol  2 puff Inhalation BID  . oseltamivir  75 mg Oral BID  . warfarin  6 mg Oral ONCE-1800  . Warfarin - Pharmacist Dosing Inpatient   Does not apply q1800   Continuous Infusions: . sodium chloride 10 mL/hr at 07/12/15 1702    Time spent coordinating care: 25 minutes, greater  than 50% of this time was spent in bedside counseling   Pamella Pert, MD [facility] Pager [phone]. If 7 PM - 7 AM, please contact night-coverage at [email or web address], password [facility] 07/13/2015, 1:56 PM  LOS: 1 day

## 2015-07-13 NOTE — Discharge Instructions (Addendum)
Information on my medicine - Coumadin®   (Warfarin) ° °This medication education was reviewed with me or my healthcare representative as part of my discharge preparation.  The pharmacist that spoke with me during my hospital stay was:  Baird, Haley P, RPH ° °Why was Coumadin prescribed for you? °Coumadin was prescribed for you because you have a blood clot or a medical condition that can cause an increased risk of forming blood clots. Blood clots can cause serious health problems by blocking the flow of blood to the heart, lung, or brain. Coumadin can prevent harmful blood clots from forming. °As a reminder your indication for Coumadin is:   Deep Vein Thrombosis Treatment ° °What test will check on my response to Coumadin? °While on Coumadin (warfarin) you will need to have an INR test regularly to ensure that your dose is keeping you in the desired range. The INR (international normalized ratio) number is calculated from the result of the laboratory test called prothrombin time (PT). ° °If an INR APPOINTMENT HAS NOT ALREADY BEEN MADE FOR YOU please schedule an appointment to have this lab work done by your health care provider within 7 days. °Your INR goal is usually a number between:  2 to 3 or your provider may give you a more narrow range like 2-2.5.  Ask your health care provider during an office visit what your goal INR is. ° °What  do you need to  know  About  COUMADIN? °Take Coumadin (warfarin) exactly as prescribed by your healthcare provider about the same time each day.  DO NOT stop taking without talking to the doctor who prescribed the medication.  Stopping without other blood clot prevention medication to take the place of Coumadin may increase your risk of developing a new clot or stroke.  Get refills before you run out. ° °What do you do if you miss a dose? °If you miss a dose, take it as soon as you remember on the same day then continue your regularly scheduled regimen the next day.  Do not take  two doses of Coumadin at the same time. ° °Important Safety Information °A possible side effect of Coumadin (Warfarin) is an increased risk of bleeding. You should call your healthcare provider right away if you experience any of the following: °? Bleeding from an injury or your nose that does not stop. °? Unusual colored urine (red or dark brown) or unusual colored stools (red or black). °? Unusual bruising for unknown reasons. °? A serious fall or if you hit your head (even if there is no bleeding). ° °Some foods or medicines interact with Coumadin® (warfarin) and might alter your response to warfarin. To help avoid this: °? Eat a balanced diet, maintaining a consistent amount of Vitamin K. °? Notify your provider about major diet changes you plan to make. °? Avoid alcohol or limit your intake to 1 drink for women and 2 drinks for men per day. °(1 drink is 5 oz. wine, 12 oz. beer, or 1.5 oz. liquor.) ° °Make sure that ANY health care provider who prescribes medication for you knows that you are taking Coumadin (warfarin).  Also make sure the healthcare provider who is monitoring your Coumadin knows when you have started a new medication including herbals and non-prescription products. ° °Coumadin® (Warfarin)  Major Drug Interactions  °Increased Warfarin Effect Decreased Warfarin Effect  °Alcohol (large quantities) °Antibiotics (esp. Septra/Bactrim, Flagyl, Cipro) °Amiodarone (Cordarone) °Aspirin (ASA) °Cimetidine (Tagamet) °Megestrol (Megace) °NSAIDs (ibuprofen, naproxen, etc.) °  Piroxicam (Feldene) Propafenone (Rythmol SR) Propranolol (Inderal) Isoniazid (INH) Posaconazole (Noxafil) Barbiturates (Phenobarbital) Carbamazepine (Tegretol) Chlordiazepoxide (Librium) Cholestyramine (Questran) Griseofulvin Oral Contraceptives Rifampin Sucralfate (Carafate) Vitamin K   Coumadin (Warfarin) Major Herbal Interactions  Increased Warfarin Effect Decreased Warfarin Effect  Garlic Ginseng Ginkgo biloba  Coenzyme Q10 Green tea St. Johns wort    Coumadin (Warfarin) FOOD Interactions  Eat a consistent number of servings per week of foods HIGH in Vitamin K (1 serving =  cup)  Collards (cooked, or boiled & drained) Kale (cooked, or boiled & drained) Mustard greens (cooked, or boiled & drained) Parsley *serving size only =  cup Spinach (cooked, or boiled & drained) Swiss chard (cooked, or boiled & drained) Turnip greens (cooked, or boiled & drained)  Eat a consistent number of servings per week of foods MEDIUM-HIGH in Vitamin K (1 serving = 1 cup)  Asparagus (cooked, or boiled & drained) Broccoli (cooked, boiled & drained, or raw & chopped) Brussel sprouts (cooked, or boiled & drained) *serving size only =  cup Lettuce, raw (green leaf, endive, romaine) Spinach, raw Turnip greens, raw & chopped   These websites have more information on Coumadin (warfarin):  http://www.king-russell.com/; https://www.hines.net/;  PLEASE HOLD COUMADIN TONIGHT, 2/13 AND TOMORROW NIGHT, 2/14. RESUME YOUR COUMADIN 2/15. CHECK WITH YOUR PCP FOR REPEAT INR IN 3-4 DAYS AFTERWARDS  Follow with PCP in 5-7 days  Please get a complete blood count and chemistry panel checked by your Primary MD at your next visit, and again as instructed by your Primary MD. Please get your medications reviewed and adjusted by your Primary MD.  Please request your Primary MD to go over all Hospital Tests and Procedure/Radiological results at the follow up, please get all Hospital records sent to your Prim MD by signing hospital release before you go home.  If you had Pneumonia of Lung problems at the Hospital: Please get a 2 view Chest X ray done in 6-8 weeks after hospital discharge or sooner if instructed by your Primary MD.  If you have Congestive Heart Failure: Please call your Cardiologist or Primary MD anytime you have any of the following symptoms:  1) 3 pound weight gain in 24 hours or 5 pounds in 1 week  2) shortness  of breath, with or without a dry hacking cough  3) swelling in the hands, feet or stomach  4) if you have to sleep on extra pillows at night in order to breathe  Follow cardiac low salt diet and 1.5 lit/day fluid restriction.  If you have diabetes Accuchecks 4 times/day, Once in AM empty stomach and then before each meal. Log in all results and show them to your primary doctor at your next visit. If any glucose reading is under 80 or above 300 call your primary MD immediately.  If you have Seizure/Convulsions/Epilepsy: Please do not drive, operate heavy machinery, participate in activities at heights or participate in high speed sports until you have seen by Primary MD or a Neurologist and advised to do so again.  If you had Gastrointestinal Bleeding: Please ask your Primary MD to check a complete blood count within one week of discharge or at your next visit. Your endoscopic/colonoscopic biopsies that are pending at the time of discharge, will also need to followed by your Primary MD.  Get Medicines reviewed and adjusted. Please take all your medications with you for your next visit with your Primary MD  Please request your Primary MD to go over all hospital tests and procedure/radiological results  at the follow up, please ask your Primary MD to get all Hospital records sent to his/her office.  If you experience worsening of your admission symptoms, develop shortness of breath, life threatening emergency, suicidal or homicidal thoughts you must seek medical attention immediately by calling 911 or calling your MD immediately  if symptoms less severe.  You must read complete instructions/literature along with all the possible adverse reactions/side effects for all the Medicines you take and that have been prescribed to you. Take any new Medicines after you have completely understood and accpet all the possible adverse reactions/side effects.   Do not drive or operate heavy machinery when  taking Pain medications.   Do not take more than prescribed Pain, Sleep and Anxiety Medications  Special Instructions: If you have smoked or chewed Tobacco  in the last 2 yrs please stop smoking, stop any regular Alcohol  and or any Recreational drug use.  Wear Seat belts while driving.  Please note You were cared for by a hospitalist during your hospital stay. If you have any questions about your discharge medications or the care you received while you were in the hospital after you are discharged, you can call the unit and asked to speak with the hospitalist on call if the hospitalist that took care of you is not available. Once you are discharged, your primary care physician will handle any further medical issues. Please note that NO REFILLS for any discharge medications will be authorized once you are discharged, as it is imperative that you return to your primary care physician (or establish a relationship with a primary care physician if you do not have one) for your aftercare needs so that they can reassess your need for medications and monitor your lab values.  You can reach the hospitalist office at phone [phone] or fax [phone]   If you do not have a primary care physician, you can call [phone] for a physician referral.  Activity: As tolerated with Full fall precautions use walker/cane & assistance as needed  Diet: regular  Disposition Home

## 2015-07-14 LAB — GLUCOSE, CAPILLARY
GLUCOSE-CAPILLARY: 247 mg/dL — AB (ref 65–99)
GLUCOSE-CAPILLARY: 279 mg/dL — AB (ref 65–99)
GLUCOSE-CAPILLARY: 368 mg/dL — AB (ref 65–99)
Glucose-Capillary: 261 mg/dL — ABNORMAL HIGH (ref 65–99)
Glucose-Capillary: 298 mg/dL — ABNORMAL HIGH (ref 65–99)
Glucose-Capillary: 345 mg/dL — ABNORMAL HIGH (ref 65–99)

## 2015-07-14 LAB — BASIC METABOLIC PANEL
ANION GAP: 11 (ref 5–15)
BUN: 17 mg/dL (ref 6–20)
CO2: 27 mmol/L (ref 22–32)
Calcium: 9.2 mg/dL (ref 8.9–10.3)
Chloride: 100 mmol/L — ABNORMAL LOW (ref 101–111)
Creatinine, Ser: 0.85 mg/dL (ref 0.44–1.00)
GLUCOSE: 458 mg/dL — AB (ref 65–99)
POTASSIUM: 3.4 mmol/L — AB (ref 3.5–5.1)
Sodium: 138 mmol/L (ref 135–145)

## 2015-07-14 LAB — CBC
HEMATOCRIT: 40.1 % (ref 36.0–46.0)
Hemoglobin: 12.6 g/dL (ref 12.0–15.0)
MCH: 25.7 pg — ABNORMAL LOW (ref 26.0–34.0)
MCHC: 31.4 g/dL (ref 30.0–36.0)
MCV: 81.7 fL (ref 78.0–100.0)
Platelets: 197 10*3/uL (ref 150–400)
RBC: 4.91 MIL/uL (ref 3.87–5.11)
RDW: 16.2 % — ABNORMAL HIGH (ref 11.5–15.5)
WBC: 25 10*3/uL — AB (ref 4.0–10.5)

## 2015-07-14 LAB — PROTIME-INR
INR: 3.12 — ABNORMAL HIGH (ref 0.00–1.49)
Prothrombin Time: 31.5 seconds — ABNORMAL HIGH (ref 11.6–15.2)

## 2015-07-14 MED ORDER — POTASSIUM CHLORIDE CRYS ER 20 MEQ PO TBCR
40.0000 meq | EXTENDED_RELEASE_TABLET | Freq: Once | ORAL | Status: AC
  Administered 2015-07-14: 40 meq via ORAL
  Filled 2015-07-14: qty 2

## 2015-07-14 MED ORDER — IPRATROPIUM-ALBUTEROL 0.5-2.5 (3) MG/3ML IN SOLN
3.0000 mL | Freq: Four times a day (QID) | RESPIRATORY_TRACT | Status: DC
  Administered 2015-07-15 – 2015-07-16 (×6): 3 mL via RESPIRATORY_TRACT
  Filled 2015-07-14 (×6): qty 3

## 2015-07-14 NOTE — Consult Note (Signed)
ANTICOAGULATION CONSULT NOTE  Pharmacy Consult for Coumadin Indication: hx DVT  Allergies  Allergen Reactions  . Clindamycin/Lincomycin Hives  . Pneumococcal Vaccines Other (See Comments)    Left scar   . Smallpox Vaccine Hives    Patient Measurements: Height:  (167.6 cm) Weight: 197 lb 1.5 oz (89.4 kg) IBW/kg (Calculated) : 59.3  Vital Signs: Temp: 97.8 F (36.6 C) (02/11 0443) Temp Source: Oral (02/11 0443) BP: 115/51 mmHg (02/11 0443) Pulse Rate: 89 (02/11 0443)  Labs:  Recent Labs  07/12/15 0649 07/12/15 0759 07/13/15 0635 07/14/15 1019  HGB 13.5  --  12.4 12.6  HCT 41.1  --  40.3 40.1  PLT 175  --  184 197  LABPROT 22.1*  --  22.0* 31.5*  INR 1.94*  --  1.94* 3.12*  CREATININE 0.85  --  0.66  --   TROPONINI  --  <0.03  --   --     Estimated Creatinine Clearance: 73.7 mL/min (by C-G formula based on Cr of 0.66).   Medical History: Past Medical History  Diagnosis Date  . CHF (congestive heart failure) (HCC)   . Asthma    Assessment: 69yof on coumadin for hx DVT. Hgb and plts WNL and stable. No bleeding noted.  Home dose:  daily - last taken 2/8. INR 1.94>3.12- jump likely from antibiotics and IV steroids.  Goal of Therapy:  INR 2-3 Monitor platelets by anticoagulation protocol: Yes   Plan:  Hold warfarin tonight  Daily INR Monitor s/sx bleeding  Tavionna Grout D. Dolores Mcgovern, PharmD, BCPS Clinical Pharmacist Pager: [phone] 07/14/2015 11:13 AM

## 2015-07-14 NOTE — Progress Notes (Addendum)
PROGRESS NOTE  Gabriella Hudson JXB:[id number] DOB: 17-Apr-1946 DOA: 07/12/2015 PCP: No primary care provider on file.   HPI: 70 year old female patient with ongoing asthma onset as adult, remote history of DVT on Coumadin who presents to the ER with shortness of breath, was hypoxic on admission, and she was found to have influenza.  Subjective / 24 H Interval events - Appreciate ongoing improvement, however feels very weak, and still dyspneic with ambulating to the bedside commode  Assessment/Plan: Principal Problem:   Acute respiratory failure with hypoxia (HCC) Active Problems:   Influenza A   History of DVT (deep vein thrombosis)   Acute hyperglycemia   Asthma   Abnormal urinalysis   Acute on chronic respiratory failure with hypoxia (HCC)   Acute respiratory failure with hypoxia due to Influenza A and asthma exacerbation - Patient improving with antibiotics, antivirals, breathing treatments and steroids. Continue current management, provide oxygen as needed and at the same time wean off as tolerated. Leukocytosis noted, likely due to steroids - Respiratory status with mild improvement, however she is still hypoxic - Blood culture with gram-positive rods in 1 out of 2 bottles, too early to tell if it's a contaminant.  - Urine culture speciated with multiple species present, and this is final - Sputum cultures are pending  Type 2 diabetes mellitus - Discussed with the patient today, she states that she is been having hyperglycemia when she gets steroids, this happen several times a year, has not had any steroids in the last 3 months other than knee injections - Hemoglobin A1c is elevated at 9.5, Suggesting true diabetes rather than just occasional hyperglycemia in the setting of steroids - Diabetes educator consulted, appreciate input - Discussed again with the patient today  Abnormal urinalysis - Urine culture with multiple species present, final  History of DVT (deep vein  thrombosis) - Continue preadmission Coumadin with pharmacy dosing - INR jumped from 1.9-3.1, continue to monitor  Lower extremity swelling - Stable, improved somewhat after yesterday's Lasix   Diet: Diet Heart Room service appropriate?: Yes; Fluid consistency:: Thin Fluids: None  DVT Prophylaxis: Coumadin  Code Status: Full Code Family Communication: no family bedside  Disposition Plan: home when ready  Barriers to discharge: hypoxia, IV therapies  Consultants:  None   Procedures:  None    Antibiotics Azithromycin 2/9>> Ceftriaxone 2/9>> Tamiflu 2/9>>   Studies  No results found.  Objective  Filed Vitals:   07/14/15 0411 07/14/15 0443 07/14/15 0851 07/14/15 1126  BP:  115/51    Pulse:  89    Temp:  97.8 F (36.6 C)    TempSrc:  Oral    Resp:  20    Height:      Weight:      SpO2: 97% 92% 95% 99%    Intake/Output Summary (Last 24 hours) at 07/14/15 1357 Last data filed at 07/14/15 0440  Gross per 24 hour  Intake    420 ml  Output    700 ml  Net   -280 ml   Filed Weights   07/12/15 0638 07/12/15 1249  Weight: 104.327 kg (230 lb) 89.4 kg (197 lb 1.5 oz)    Exam:  GENERAL: NAD  HEENT: no scleral icterus, PERRL  NECK: supple, no LAD  LUNGS: minimal wheezing bilateral lung fields, moving air well   HEART: RRR without MRG, trace lower extremity edema   ABDOMEN: soft, non tender  EXTREMITIES: no clubbing / cyanosis  NEUROLOGIC: non focal  Data Reviewed: Basic Metabolic Panel:  Recent Labs Lab 07/12/15 0649 07/13/15 0635 07/14/15 1019  NA 138 141 138  K 3.9 3.9 3.4*  CL 100* 106 100*  CO2 GLUCOSE 270* 202* 458*  BUN CREATININE 0.85 0.66 0.85  CALCIUM 8.9 9.2 9.2   Liver Function Tests:  Recent Labs Lab 07/12/15 0759 07/13/15 0635  AST 21 18  ALT 17 14  ALKPHOS 96 75  BILITOT 0.8 0.3  PROT 6.4* 5.9*  ALBUMIN 2.9* 2.3*    Recent Labs Lab 07/12/15 0759  LIPASE 21   CBC:  Recent Labs Lab  07/12/15 0649 07/12/15 0759 07/13/15 0635 07/14/15 1019  WBC 16.0*  --  21.1* 25.0*  NEUTROABS  --  14.1*  --   --   HGB 13.5  --  12.4 12.6  HCT 41.1  --  40.3 40.1  MCV 82.0  --  83.1 81.7  PLT 175  --  184 197   Cardiac Enzymes:  Recent Labs Lab 07/12/15 0759  TROPONINI <0.03   BNP (last 3 results)  Recent Labs  07/12/15 0649  BNP 53.9   CBG:  Recent Labs Lab 07/13/15 2015 07/13/15 2354 07/14/15 0424 07/14/15 0724 07/14/15 1215  GLUCAP 339* 261* 247* 279* 368*    Recent Results (from the past 240 hour(s))  Blood Culture (routine x 2)     Status: None (Preliminary result)   Collection Time: 07/12/15  7:35 AM  Result Value Ref Range Status   Specimen Description BLOOD LEFT FINGER  Final   Special Requests IN PEDIATRIC BOTTLE 4CCS  Final   Culture NO GROWTH 2 DAYS  Final   Report Status PENDING  Incomplete  Blood Culture (routine x 2)     Status: None (Preliminary result)   Collection Time: 07/12/15  7:50 AM  Result Value Ref Range Status   Specimen Description BLOOD LEFT WRIST  Final   Special Requests IN PEDIATRIC BOTTLE 5CCS  Final   Culture  Setup Time   Final    GRAM POSITIVE RODS AEROBIC BOTTLE ONLY CRITICAL RESULT CALLED TO, READ BACK BY AND VERIFIED WITH: K ROMESERG  07/14/15 MKELLY    Culture NO GROWTH 2 DAYS  Final   Report Status PENDING  Incomplete  Urine culture     Status: None   Collection Time: 07/12/15  9:15 AM  Result Value Ref Range Status   Specimen Description URINE, CLEAN CATCH  Final   Special Requests NONE  Final   Culture MULTIPLE SPECIES PRESENT, SUGGEST RECOLLECTION  Final   Report Status 07/13/2015 FINAL  Final     Scheduled Meds: . azithromycin  500 mg Intravenous Q24H  . cefTRIAXone (ROCEPHIN)  IV  1 g Intravenous Q24H  . insulin aspart  0-9 Units Subcutaneous 6 times per day  . ipratropium-albuterol  3 mL Nebulization Q4H  . levETIRAcetam  1,500 mg Oral QHS  . methylPREDNISolone (SOLU-MEDROL) injection  60 mg  Intravenous Q6H  . mometasone-formoterol  2 puff Inhalation BID  . oseltamivir  75 mg Oral BID  . Warfarin - Pharmacist Dosing Inpatient   Does not apply q1800   Continuous Infusions: . sodium chloride 10 mL/hr at 07/12/15 1702   Pamella Pert, MD [facility] Pager [phone]. If 7 PM - 7 AM, please contact night-coverage at [email or web address], password [facility] 07/14/2015, 1:57 PM  LOS: 2 days

## 2015-07-15 LAB — CBC
HCT: 38 % (ref 36.0–46.0)
HEMOGLOBIN: 12.4 g/dL (ref 12.0–15.0)
MCH: 26.4 pg (ref 26.0–34.0)
MCHC: 32.6 g/dL (ref 30.0–36.0)
MCV: 81 fL (ref 78.0–100.0)
PLATELETS: 398 10*3/uL (ref 150–400)
RBC: 4.69 MIL/uL (ref 3.87–5.11)
RDW: 18.3 % — ABNORMAL HIGH (ref 11.5–15.5)
WBC: 23.7 10*3/uL — AB (ref 4.0–10.5)

## 2015-07-15 LAB — GLUCOSE, CAPILLARY
GLUCOSE-CAPILLARY: 225 mg/dL — AB (ref 65–99)
Glucose-Capillary: 204 mg/dL — ABNORMAL HIGH (ref 65–99)
Glucose-Capillary: 262 mg/dL — ABNORMAL HIGH (ref 65–99)
Glucose-Capillary: 328 mg/dL — ABNORMAL HIGH (ref 65–99)
Glucose-Capillary: 364 mg/dL — ABNORMAL HIGH (ref 65–99)
Glucose-Capillary: 369 mg/dL — ABNORMAL HIGH (ref 65–99)

## 2015-07-15 LAB — PROTIME-INR
INR: 4.5 — ABNORMAL HIGH (ref 0.00–1.49)
PROTHROMBIN TIME: 41.5 s — AB (ref 11.6–15.2)

## 2015-07-15 NOTE — Progress Notes (Signed)
PROGRESS NOTE  Gabriella Hudson ZOX:[id number] DOB: September 26, 1945 DOA: 07/12/2015 PCP: No primary care provider on file.   HPI: 70 year old female patient with ongoing asthma onset as adult, remote history of DVT on Coumadin who presents to the ER with shortness of breath, was hypoxic on admission, and she was found to have influenza.  Subjective / 24 H Interval events - Continues to improve, spent most of the afternoon and overnight without oxygen however oxygen noted to be placed back on this morning. She appreciates improvement in her breathing.  Assessment/Plan: Principal Problem:   Acute respiratory failure with hypoxia (HCC) Active Problems:   Influenza A   History of DVT (deep vein thrombosis)   Acute hyperglycemia   Asthma   Abnormal urinalysis   Acute on chronic respiratory failure with hypoxia (HCC)   Acute respiratory failure with hypoxia due to Influenza A and asthma exacerbation - Patient improving with antibiotics, antivirals, breathing treatments and steroids. Continue current management, provide oxygen as needed and at the same time wean off as tolerated. Leukocytosis noted, likely due to steroids - Respiratory status with mild improvement, however she is still hypoxic - Blood culture with gram-positive rods in 1 out of 2 bottles, this speciated as diphtheroids, likely contaminant - Urine culture speciated with multiple species present, and this is final - Sputum cultures are pending  Type 2 diabetes mellitus - Discussed with the patient today, she states that she is been having hyperglycemia when she gets steroids, this happen several times a year, has not had any steroids in the last 3 months other than knee injections - Hemoglobin A1c is elevated at 9.5, Suggesting true diabetes rather than just occasional hyperglycemia in the setting of steroids - Diabetes educator consulted, appreciate input  Abnormal urinalysis - Urine culture with multiple species present,  final  History of DVT (deep vein thrombosis) - Continue preadmission Coumadin with pharmacy dosing - INR jumped from 1.9 >> 3.1 >> 4.5, likely in the setting of antibiotic use as well as critical illness  - Hold Coumadin   Lower extremity swelling - Stable   Diet: Diet Heart Room service appropriate?: Yes; Fluid consistency:: Thin Fluids: None  DVT Prophylaxis: Coumadin  Code Status: Full Code Family Communication: no family bedside  Disposition Plan: home when ready  Barriers to discharge: hypoxia, IV therapies  Consultants:  None   Procedures:  None    Antibiotics Azithromycin 2/9>> Ceftriaxone 2/9>> Tamiflu 2/9>>   Studies  No results found.  Objective  Filed Vitals:   07/15/15 0230 07/15/15 0450 07/15/15 0813 07/15/15 1159  BP:  131/68    Pulse:  74    Temp:  97.5 F (36.4 C)    TempSrc:  Oral    Resp:  18    Height:      Weight:      SpO2: 94% 93% 93% 95%    Intake/Output Summary (Last 24 hours) at 07/15/15 1307 Last data filed at 07/15/15 1033  Gross per 24 hour  Intake 1208.83 ml  Output    850 ml  Net 358.83 ml   Filed Weights   07/12/15 0638 07/12/15 1249  Weight: 104.327 kg (230 lb) 89.4 kg (197 lb 1.5 oz)    Exam:  GENERAL: NAD  HEENT: no scleral icterus, PERRL  NECK: supple, no LAD  LUNGS: minimal wheezing bilateral lung fields, moving air well   HEART: RRR without MRG, trace lower extremity edema   ABDOMEN: soft, non tender  EXTREMITIES: no clubbing / cyanosis  NEUROLOGIC: non focal  Data Reviewed: Basic Metabolic Panel:  Recent Labs Lab 07/12/15 0649 07/13/15 0635 07/14/15 1019  NA 138 141 138  K 3.9 3.9 3.4*  CL 100* 106 100*  CO2 GLUCOSE 270* 202* 458*  BUN CREATININE 0.85 0.66 0.85  CALCIUM 8.9 9.2 9.2   Liver Function Tests:  Recent Labs Lab 07/12/15 0759 07/13/15 0635  AST 21 18  ALT 17 14  ALKPHOS 96 75  BILITOT 0.8 0.3  PROT 6.4* 5.9*  ALBUMIN 2.9* 2.3*    Recent  Labs Lab 07/12/15 0759  LIPASE 21   CBC:  Recent Labs Lab 07/12/15 0649 07/12/15 0759 07/13/15 0635 07/14/15 1019 07/15/15 0427  WBC 16.0*  --  21.1* 25.0* 23.7*  NEUTROABS  --  14.1*  --   --   --   HGB 13.5  --  12.4 12.6 12.4  HCT 41.1  --  40.3 40.1 38.0  MCV 82.0  --  83.1 81.7 81.0  PLT 175  --  184 197 398   Cardiac Enzymes:  Recent Labs Lab 07/12/15 0759  TROPONINI <0.03   BNP (last 3 results)  Recent Labs  07/12/15 0649  BNP 53.9   CBG:  Recent Labs Lab 07/14/15 2030 07/15/15 0018 07/15/15 0448 07/15/15 0745 07/15/15 1207  GLUCAP 298* 262* 225* 204* 364*    Recent Results (from the past 240 hour(s))  Blood Culture (routine x 2)     Status: None (Preliminary result)   Collection Time: 07/12/15  7:35 AM  Result Value Ref Range Status   Specimen Description BLOOD LEFT FINGER  Final   Special Requests IN PEDIATRIC BOTTLE 4CCS  Final   Culture NO GROWTH 3 DAYS  Final   Report Status PENDING  Incomplete  Blood Culture (routine x 2)     Status: None (Preliminary result)   Collection Time: 07/12/15  7:50 AM  Result Value Ref Range Status   Specimen Description BLOOD LEFT WRIST  Final   Special Requests IN PEDIATRIC BOTTLE 5CCS  Final   Culture  Setup Time   Final    GRAM POSITIVE RODS AEROBIC BOTTLE ONLY CRITICAL RESULT CALLED TO, READ BACK BY AND VERIFIED WITH: K ROMESERG  07/14/15 MKELLY    Culture   Final    DIPHTHEROIDS(CORYNEBACTERIUM SPECIES) Standardized susceptibility testing for this organism is not available.    Report Status PENDING  Incomplete  Urine culture     Status: None   Collection Time: 07/12/15  9:15 AM  Result Value Ref Range Status   Specimen Description URINE, CLEAN CATCH  Final   Special Requests NONE  Final   Culture MULTIPLE SPECIES PRESENT, SUGGEST RECOLLECTION  Final   Report Status 07/13/2015 FINAL  Final     Scheduled Meds: . azithromycin  500 mg Intravenous Q24H  . cefTRIAXone (ROCEPHIN)  IV  1 g  Intravenous Q24H  . insulin aspart  0-9 Units Subcutaneous 6 times per day  . ipratropium-albuterol  3 mL Nebulization QID  . levETIRAcetam  1,500 mg Oral QHS  . methylPREDNISolone (SOLU-MEDROL) injection  60 mg Intravenous Q6H  . mometasone-formoterol  2 puff Inhalation BID  . oseltamivir  75 mg Oral BID  . Warfarin - Pharmacist Dosing Inpatient   Does not apply q1800   Continuous Infusions: . sodium chloride 10 mL/hr at 07/12/15 1702   Pamella Pert, MD [facility] Pager [phone]. If 7 PM - 7 AM, please contact night-coverage at [email or web address],  password [email or web address] 07/15/2015, 1:07 PM  LOS: 3 days

## 2015-07-15 NOTE — Progress Notes (Signed)
Straight cath not obtained at this time. Stool and urine appears to be mixing. MD made aware, no further orders at this time. Will continue to monitor.

## 2015-07-15 NOTE — Evaluation (Signed)
Physical Therapy Evaluation Patient Details Name: Gabriella Hudson MRN: [id number] DOB: 07/08/1945 Today's Date: 07/15/2015   History of Present Illness  70 year old female patient with ongoing asthma onset as adult, remote history of DVT on Coumadin who presents to the ER with shortness of breath, was hypoxic on admission, and she was found to have influenza.  Clinical Impression   Pt admitted with above diagnosis. Pt currently with functional limitations due to the deficits listed below (see PT Problem List).  Pt will benefit from skilled PT to increase their independence and safety with mobility to allow discharge to the venue listed below.       Follow Up Recommendations Home health PT    Equipment Recommendations  3in1 (PT) (short)    Recommendations for Other Services OT consult     Precautions / Restrictions Precautions Precautions: Other (comment) (Droplet) Precaution Comments: She seems to get anxious when not on supplemental O2      Mobility  Bed Mobility                  Transfers Overall transfer level: Needs assistance Equipment used: Rolling walker (2 wheeled) Transfers: Sit to/from Stand Sit to Stand: Min guard         General transfer comment: Cues for hand placement and safety; slow moving and requires rocking and momentum, however does not require physical assist  Ambulation/Gait Ambulation/Gait assistance: Min guard Ambulation Distance (Feet): 55 Feet Assistive device: Rolling walker (2 wheeled) Gait Pattern/deviations: Decreased step length - right;Decreased step length - left;Decreased stride length Gait velocity: extremely slow   General Gait Details: Cues to self-monitor for activity tolerance; Noted DOE2/4  Stairs            Wheelchair Mobility    Modified Rankin (Stroke Patients Only)       Balance Overall balance assessment: Needs assistance           Standing balance-Leahy Scale: Fair                                Pertinent Vitals/Pain Pain Assessment: No/denies pain    Home Living Family/patient expects to be discharged to:: Private residence Living Arrangements: Children Available Help at Discharge: Family;Available PRN/intermittently (friends and students assist as well) Type of Home: House Home Access: Ramped entrance     Home Layout: One level Home Equipment: Environmental consultant - 2 wheels      Prior Function Level of Independence: Independent with assistive device(s)         Comments: RW outside; she has questions re: the best tub/shower seat     Hand Dominance        Extremity/Trunk Assessment   Upper Extremity Assessment: Generalized weakness           Lower Extremity Assessment: Generalized weakness         Communication   Communication: No difficulties  Cognition Arousal/Alertness: Awake/alert Behavior During Therapy: WFL for tasks assessed/performed Overall Cognitive Status: Within Functional Limits for tasks assessed                      General Comments General comments (skin integrity, edema, etc.): Ambulated on Room Air and O2 sats decr to 91% observed lowest; Reapplied O2 once in room; she seems more comfortable with O2 on    Exercises        Assessment/Plan    PT Assessment Patient needs continued PT services  PT  Diagnosis Generalized weakness   PT Problem List Decreased strength;Decreased activity tolerance;Decreased balance;Decreased mobility;Decreased knowledge of use of DME;Obesity  PT Treatment Interventions DME instruction;Gait training;Functional mobility training;Therapeutic activities;Therapeutic exercise;Balance training;Patient/family education   PT Goals (Current goals can be found in the Care Plan section) Acute Rehab PT Goals Patient Stated Goal: to get better PT Goal Formulation: With patient Time For Goal Achievement: 07/29/15 Potential to Achieve Goals: Good    Frequency Min 3X/week   Barriers to discharge         Co-evaluation               End of Session Equipment Utilized During Treatment: Gait belt Activity Tolerance: Patient tolerated treatment well Patient left: in chair;with call bell/phone within reach;with chair alarm set Nurse Communication: Mobility status         Time: 1610-9604 PT Time Calculation (min) (ACUTE ONLY): 28 min   Charges:   PT Evaluation $PT Eval Moderate Complexity: 1 Procedure PT Treatments $Gait Training: 8-22 mins   PT G Codes:        Olen Pel 07/15/2015, 3:39 PM  [place], [place]  Acute Rehabilitation Services Pager [phone] Office [phone]

## 2015-07-15 NOTE — Progress Notes (Signed)
UR COMPLETED  

## 2015-07-15 NOTE — Consult Note (Signed)
ANTICOAGULATION CONSULT NOTE  Pharmacy Consult for Coumadin Indication: hx DVT  Allergies  Allergen Reactions  . Clindamycin/Lincomycin Hives  . Pneumococcal Vaccines Other (See Comments)    Left scar   . Smallpox Vaccine Hives    Patient Measurements: Height:  (167.6 cm) Weight: 197 lb 1.5 oz (89.4 kg) IBW/kg (Calculated) : 59.3  Vital Signs: Temp: 97.5 F (36.4 C) (02/12 0450) Temp Source: Oral (02/12 0450) BP: 131/68 mmHg (02/12 0450) Pulse Rate: 74 (02/12 0450)  Labs:  Recent Labs  07/13/15 0635 07/14/15 1019 07/15/15 0427  HGB 12.4 12.6 12.4  HCT 40.3 40.1 38.0  PLT 184 197 PENDING  LABPROT 22.0* 31.5* 41.5*  INR 1.94* 3.12* 4.50*  CREATININE 0.66 0.85  --     Estimated Creatinine Clearance: 69.3 mL/min (by C-G formula based on Cr of 0.85).  Assessment: 69yof on coumadin for hx DVT. Hgb and plts WNL and stable. No bleeding noted.  Home dose:  daily - last taken 2/8.  INR 1.94>3.12> 4.5- jump likely from antibiotics and IV steroids. Dose was held on 2/11. Last dose of antibiotics is tomorrow, no stop date on steroids yet.  Goal of Therapy:  INR 2-3 Monitor platelets by anticoagulation protocol: Yes   Plan:  Hold warfarin tonight  Daily INR Monitor s/sx bleeding  Gabriella Hudson D. Jaloni Davoli, PharmD, BCPS Clinical Pharmacist Pager: [phone] 07/15/2015 10:34 AM

## 2015-07-16 LAB — CULTURE, BLOOD (ROUTINE X 2)

## 2015-07-16 LAB — PROTIME-INR
INR: 4.33 — ABNORMAL HIGH (ref 0.00–1.49)
PROTHROMBIN TIME: 40.3 s — AB (ref 11.6–15.2)

## 2015-07-16 LAB — GLUCOSE, CAPILLARY
GLUCOSE-CAPILLARY: 203 mg/dL — AB (ref 65–99)
Glucose-Capillary: 209 mg/dL — ABNORMAL HIGH (ref 65–99)
Glucose-Capillary: 241 mg/dL — ABNORMAL HIGH (ref 65–99)
Glucose-Capillary: 254 mg/dL — ABNORMAL HIGH (ref 65–99)
Glucose-Capillary: 318 mg/dL — ABNORMAL HIGH (ref 65–99)

## 2015-07-16 MED ORDER — PREDNISONE 20 MG PO TABS
20.0000 mg | ORAL_TABLET | Freq: Every day | ORAL | Status: AC
Start: 2015-07-16 — End: ?

## 2015-07-16 MED ORDER — PREDNISONE 20 MG PO TABS
20.0000 mg | ORAL_TABLET | Freq: Every day | ORAL | Status: DC
  Administered 2015-07-16: 20 mg via ORAL
  Filled 2015-07-16: qty 1

## 2015-07-16 MED ORDER — GLYBURIDE 5 MG PO TABS: 5.0000 mg | ORAL_TABLET | Freq: Two times a day (BID) | ORAL | Status: AC

## 2015-07-16 MED ORDER — IPRATROPIUM-ALBUTEROL 0.5-2.5 (3) MG/3ML IN SOLN: 3.0000 mL | Freq: Two times a day (BID) | RESPIRATORY_TRACT | Status: DC

## 2015-07-16 NOTE — Progress Notes (Signed)
Patient discharge teaching given, including activity, diet, follow-up appoints, and medications. Patient verbalized understanding of all discharge instructions. IV access was d/c'd. Vitals are stable. Skin is intact except as charted in most recent assessments. Pt to be escorted out by NT, to be driven home by family. 

## 2015-07-16 NOTE — Consult Note (Signed)
ANTICOAGULATION CONSULT NOTE  Pharmacy Consult for Coumadin Indication: hx DVT  Allergies  Allergen Reactions  . Clindamycin/Lincomycin Hives  . Pneumococcal Vaccines Other (See Comments)    Left scar   . Smallpox Vaccine Hives    Patient Measurements: Height:  (167.6 cm) Weight: 197 lb 1.5 oz (89.4 kg) IBW/kg (Calculated) : 59.3  Vital Signs: Temp: 97.6 F (36.4 C) (02/13 0423) Temp Source: Oral (02/13 0423) BP: 160/75 mmHg (02/13 0423) Pulse Rate: 74 (02/13 0423)  Labs:  Recent Labs  07/14/15 1019 07/15/15 0427 07/16/15 0653  HGB 12.6 12.4  --   HCT 40.1 38.0  --   PLT 197 398  --   LABPROT 31.5* 41.5* 40.3*  INR 3.12* 4.50* 4.33*  CREATININE 0.85  --   --     Estimated Creatinine Clearance: 69.3 mL/min (by C-G formula based on Cr of 0.85).  Assessment: Gabriella Hudson on coumadin for hx DVT. Hgb and plts WNL and stable. No bleeding noted.  Home dose:  daily - last taken 2/8.  INR 1.94>3.12> 4.5> 4.3 - jump likely from antibiotics and IV steroids, now trending down. Dose was held on 2/11-2/12. Last dose of antibiotics is today, no stop date on steroids yet.  Goal of Therapy:  INR 2-3 Monitor platelets by anticoagulation protocol: Yes   Plan:  Hold warfarin tonight  Daily INR Monitor s/sx bleeding  Babs Bertin, PharmD, BCPS Clinical Pharmacist Pager [phone] 07/16/2015 9:10 AM

## 2015-07-16 NOTE — Discharge Summary (Signed)
Physician Discharge Summary  Gabriella Hudson ZOX:[id number] DOB: [birth year] DOA: 07/12/2015  PCP: No primary care provider on file.  Admit date: 07/12/2015 Discharge date: 07/16/2015  Time spent: >30 minutes  Recommendations for Outpatient Follow-up:  1. Follow up with local PCP in 1-2 weeks   Discharge Diagnoses:  Principal Problem:   Acute respiratory failure with hypoxia (HCC) Active Problems:   Influenza A   History of DVT (deep vein thrombosis)   Acute hyperglycemia   Asthma   Abnormal urinalysis   Acute on chronic respiratory failure with hypoxia [facility])  Discharge Condition: stable  Diet recommendation: heart healthy / carb modified  Filed Weights   07/12/15 0638 07/12/15 1249  Weight: 104.327 kg (230 lb) 89.4 kg (197 lb 1.5 oz)   History of present illness:  See H&P, Labs, Consult and Test reports for all details in brief, patient is a 70 year old female patient with ongoing asthma onset as adult, remote history of DVT on Coumadin who presents to the ER with shortness of breath, was hypoxic on admission, initially on nonrebreather, admitted with influenza  Hospital Course:  Acute respiratory failure with hypoxia due to Influenza A and asthma exacerbation - Patient improving with antibiotics, antivirals, breathing treatments and steroids. She was able to be weaned off of oxygen and able to ambulate and work with PT on the day of discharge, maintaining oxygen saturations mid 90s on room air. Blood culture with gram-positive rods in 1 out of 2 bottles, this speciated as diphtheroids, likely contaminant. Urine culture speciated with multiple species present, and this is final. She will be d/c home with [facility]. She finished 5 days of antibiotics (Ceftriaxone and Azithromycin) and antivirals (Tamiflu) while hospitalized and will not need prolonged antibiotics on d/c. She will have a quick prednisone taper on d/c. Type 2 diabetes mellitus - Discussed with the patient extensively, she  states that she is been having hyperglycemia when she gets steroids, this happen several times a year, has not had any steroids in the last 3 months other than knee injections. Hemoglobin A1c is elevated at 9.5, suggesting true diabetes rather than just occasional hyperglycemia in the setting of steroids. Diabetes educator consulted. She is very concerned about insulin due to fear of hypoglycemia and states that she generally uses Gliburide when on steroids. Will use that this time around and will defer to PCP regarding starting insulin. Abnormal urinalysis - Urine culture with multiple species present, final History of DVT (deep vein thrombosis) - Continue preadmission Coumadin with pharmacy dosing. Her INR is 4.33 on discharge, improved from 4.5 the day prior, supratherapeutic likely in the setting of antibiotics. She was instructed to hold Coumadin for 2 additional days and then resume, and follow up with Coumadin clinic in 3-5 days.  Lower extremity swelling - Stable  Procedures:  None    Consultations:  None   Discharge Exam: Filed Vitals:   07/16/15 0941 07/16/15 0946 07/16/15 1259 07/16/15 1426  BP:    139/62  Pulse:    80  Temp:    98.4 F (36.9 C)  TempSrc:      Resp:    22  Height:      Weight:      SpO2: 93% 97% 97% 96%   General: NAD Cardiovascular: RRR Respiratory: CTA biL, no wheezing  Discharge Instructions Activity:  As tolerated   Get Medicines reviewed and adjusted: Please take all your medications with you for your next visit with your Primary MD  Please request your Primary  MD to go over all hospital tests and procedure/radiological results at the follow up, please ask your Primary MD to get all Hospital records sent to his/her office.  If you experience worsening of your admission symptoms, develop shortness of breath, life threatening emergency, suicidal or homicidal thoughts you must seek medical attention immediately by calling 911 or calling your MD  immediately if symptoms less severe.  You must read complete instructions/literature along with all the possible adverse reactions/side effects for all the Medicines you take and that have been prescribed to you. Take any new Medicines after you have completely understood and accpet all the possible adverse reactions/side effects.   Do not drive when taking Pain medications.   Do not take more than prescribed Pain, Sleep and Anxiety Medications  Special Instructions: If you have smoked or chewed Tobacco in the last 2 yrs please stop smoking, stop any regular Alcohol and or any Recreational drug use.  Wear Seat belts while driving.  Please note  You were cared for by a hospitalist during your hospital stay. Once you are discharged, your primary care physician will handle any further medical issues. Please note that NO REFILLS for any discharge medications will be authorized once you are discharged, as it is imperative that you return to your primary care physician (or establish a relationship with a primary care physician if you do not have one) for your aftercare needs so that they can reassess your need for medications and monitor your lab values.    Medication List    TAKE these medications        albuterol 108 (90 Base) MCG/ACT inhaler  Commonly known as:  PROVENTIL HFA;VENTOLIN HFA  Inhale 2 puffs into the lungs every 6 (six) hours as needed for wheezing or shortness of breath.     Fluticasone-Salmeterol 250-50 MCG/DOSE Aepb  Commonly known as:  ADVAIR  Inhale 1 puff into the lungs 2 (two) times daily.     glyBURIDE 5 MG tablet  Commonly known as:  DIABETA  Take 1 tablet (5 mg total) by mouth 2 (two) times daily with a meal.     Levetiracetam 750 MG Tb24  Take 1,500 mg by mouth at bedtime.     predniSONE 20 MG tablet  Commonly known as:  DELTASONE  Take 1 tablet (20 mg total) by mouth daily with breakfast.     warfarin 4 MG tablet  Commonly known as:  COUMADIN  Take 4  mg by mouth daily.           Follow-up Information    Follow up with PCP. Schedule an appointment as soon as possible for a visit in 1 week.      [facility].   Why:  Home Health arranged for RN,PT, Aide   Contact information:   [phone]      The results of significant diagnostics from this hospitalization (including imaging, microbiology, ancillary and laboratory) are listed below for reference.    Significant Diagnostic Studies: Dg Chest Port 1 View  07/12/2015  CLINICAL DATA:  Shortness of breath for 2 days EXAM: PORTABLE CHEST 1 VIEW COMPARISON:  None. FINDINGS: Cardiac shadow is at the upper limits of normal in size. Mild aortic calcifications are seen. Elevation of the right hemidiaphragm is noted. No focal infiltrate or sizable effusion is seen. No acute bony abnormality is noted. IMPRESSION: No acute abnormality noted. Electronically Signed   By: Eulah Pont.D.  On: 07/12/2015 07:39    Microbiology: Recent Results (from the past 240 hour(s))  Blood Culture (routine x 2)     Status: None (Preliminary result)   Collection Time: 07/12/15  7:35 AM  Result Value Ref Range Status   Specimen Description BLOOD LEFT FINGER  Final   Special Requests IN PEDIATRIC BOTTLE 4CCS  Final   Culture NO GROWTH 4 DAYS  Final   Report Status PENDING  Incomplete  Blood Culture (routine x 2)     Status: None   Collection Time: 07/12/15  7:50 AM  Result Value Ref Range Status   Specimen Description BLOOD LEFT WRIST  Final   Special Requests IN PEDIATRIC BOTTLE 5CCS  Final   Culture  Setup Time   Final    GRAM POSITIVE RODS AEROBIC BOTTLE ONLY CRITICAL RESULT CALLED TO, READ BACK BY AND VERIFIED WITH: K ROMESERG  07/14/15 MKELLY    Culture   Final    DIPHTHEROIDS(CORYNEBACTERIUM SPECIES) Standardized susceptibility testing for this organism is not available.    Report Status 07/16/2015 FINAL  Final  Urine culture     Status: None    Collection Time: 07/12/15  9:15 AM  Result Value Ref Range Status   Specimen Description URINE, CLEAN CATCH  Final   Special Requests NONE  Final   Culture MULTIPLE SPECIES PRESENT, SUGGEST RECOLLECTION  Final   Report Status 07/13/2015 FINAL  Final     Labs: Basic Metabolic Panel:  Recent Labs Lab 07/12/15 0649 07/13/15 0635 07/14/15 1019  NA 138 141 138  K 3.9 3.9 3.4*  CL 100* 106 100*  CO2 GLUCOSE 270* 202* 458*  BUN CREATININE 0.85 0.66 0.85  CALCIUM 8.9 9.2 9.2   Liver Function Tests:  Recent Labs Lab 07/12/15 0759 07/13/15 0635  AST 21 18  ALT 17 14  ALKPHOS 96 75  BILITOT 0.8 0.3  PROT 6.4* 5.9*  ALBUMIN 2.9* 2.3*    Recent Labs Lab 07/12/15 0759  LIPASE 21   CBC:  Recent Labs Lab 07/12/15 0649 07/12/15 0759 07/13/15 0635 07/14/15 1019 07/15/15 0427  WBC 16.0*  --  21.1* 25.0* 23.7*  NEUTROABS  --  14.1*  --   --   --   HGB 13.5  --  12.4 12.6 12.4  HCT 41.1  --  40.3 40.1 38.0  MCV 82.0  --  83.1 81.7 81.0  PLT 175  --  184 197 398   Cardiac Enzymes:  Recent Labs Lab 07/12/15 0759  TROPONINI <0.03   BNP: BNP (last 3 results)  Recent Labs  07/12/15 0649  BNP 53.9    CBG:  Recent Labs Lab 07/16/15 0037 07/16/15 0421 07/16/15 0738 07/16/15 1153 07/16/15 1653  GLUCAP 254* 209* 203* 318* 241*       Signed:  GHERGHE, COSTIN  [facility] 07/16/2015, 5:48 PM

## 2015-07-16 NOTE — Progress Notes (Signed)
Physical Therapy Treatment Patient Details Name: Gabriella Hudson MRN: [id number] DOB: 1946/05/12 Today's Date: 07/16/2015    History of Present Illness 70 year old female patient with ongoing asthma onset as adult, remote history of DVT on Coumadin who presents to the ER with shortness of breath, was hypoxic on admission, and she was found to have influenza.    PT Comments    Pt improving with mobility, ambulated 150' with RW and supervision with O2 sats no lower than 92% on RA and HR 98 bpm, however, pt required 3 standing rest breaks during this ambulation due to 3/4 DOE which is not truly functional for household mobility yet.  PT will continue to follow.    Follow Up Recommendations  Home health PT     Equipment Recommendations  3in1 (PT) (short)    Recommendations for Other Services OT consult     Precautions / Restrictions Precautions Precautions: Other (comment) (Droplet) Restrictions Weight Bearing Restrictions: No    Mobility  Bed Mobility Overal bed mobility: Modified Independent             General bed mobility comments: pt able to get into bed from seated position with increased time and 3/4 DOE but no physical assist needed  Transfers Overall transfer level: Needs assistance Equipment used: Rolling walker (2 wheeled) Transfers: Sit to/from Stand Sit to Stand: Supervision         General transfer comment: increased time needed for transfers from recliner as well as toilet as well as heavy use of grab bar to get up from toilet. Supervisoin for safety  Ambulation/Gait Ambulation/Gait assistance: Supervision Ambulation Distance (Feet): 150 Feet Assistive device: Rolling walker (2 wheeled) Gait Pattern/deviations: Step-through pattern Gait velocity: slow Gait velocity interpretation: <1.8 ft/sec, indicative of risk for recurrent falls General Gait Details: pt required 3 standing rest breaks during 150' with DOE from 2/4 to 3/4 leading to breaks. VSS  throughout, HR 98 bpm, O2 sats no lower than 92%   Stairs            Wheelchair Mobility    Modified Rankin (Stroke Patients Only)       Balance Overall balance assessment: Needs assistance Sitting-balance support: No upper extremity supported Sitting balance-Leahy Scale: Good     Standing balance support: No upper extremity supported Standing balance-Leahy Scale: Fair Standing balance comment: pt can stand without support but gatigues very quickly, RW being used more for energy conservation than balance needs but pt insecure without UE support at this point                    Cognition Arousal/Alertness: Awake/alert Behavior During Therapy: WFL for tasks assessed/performed Overall Cognitive Status: Within Functional Limits for tasks assessed                      Exercises General Exercises - Lower Extremity Ankle Circles/Pumps: AROM;Both;10 reps;Seated Hip Flexion/Marching: AROM;5 reps;Both;Standing Heel Raises: AROM;Both;5 reps;Standing Mini-Sqauts: AROM;5 reps;Standing    General Comments General comments (skin integrity, edema, etc.): Gave pt 3 exercises to perform each time she is up to bathroom with nursing. Also advised her to ambualte 2 more times in hall today with nursing      Pertinent Vitals/Pain Pain Assessment: No/denies pain    Home Living                      Prior Function            PT Goals (current  goals can now be found in the care plan section) Acute Rehab PT Goals Patient Stated Goal: to get better PT Goal Formulation: With patient Time For Goal Achievement: 07/29/15 Potential to Achieve Goals: Good Progress towards PT goals: Progressing toward goals    Frequency  Min 3X/week    PT Plan Current plan remains appropriate    Co-evaluation             End of Session Equipment Utilized During Treatment: Gait belt Activity Tolerance: Patient tolerated treatment well Patient left: in chair;with call  bell/phone within reach;with chair alarm set     Time: 1610-9604 PT Time Calculation (min) (ACUTE ONLY): 43 min  Charges:  $Gait Training: 23-37 mins $Therapeutic Exercise: 8-22 mins                    G Codes:     Lyanne Co, PT  Acute Rehab Services  [phone]  Lyanne Co 07/16/2015, 10:46 AM

## 2015-07-17 LAB — CULTURE, BLOOD (ROUTINE X 2): CULTURE: NO GROWTH

## 2017-05-18 IMAGING — CR DG CHEST 1V PORT
1 series · 1 of 1 positions shown · non-contrast
Comparison: None.

CLINICAL DATA: Shortness of breath for 2 days

EXAM:
PORTABLE CHEST 1 VIEW

[AP]
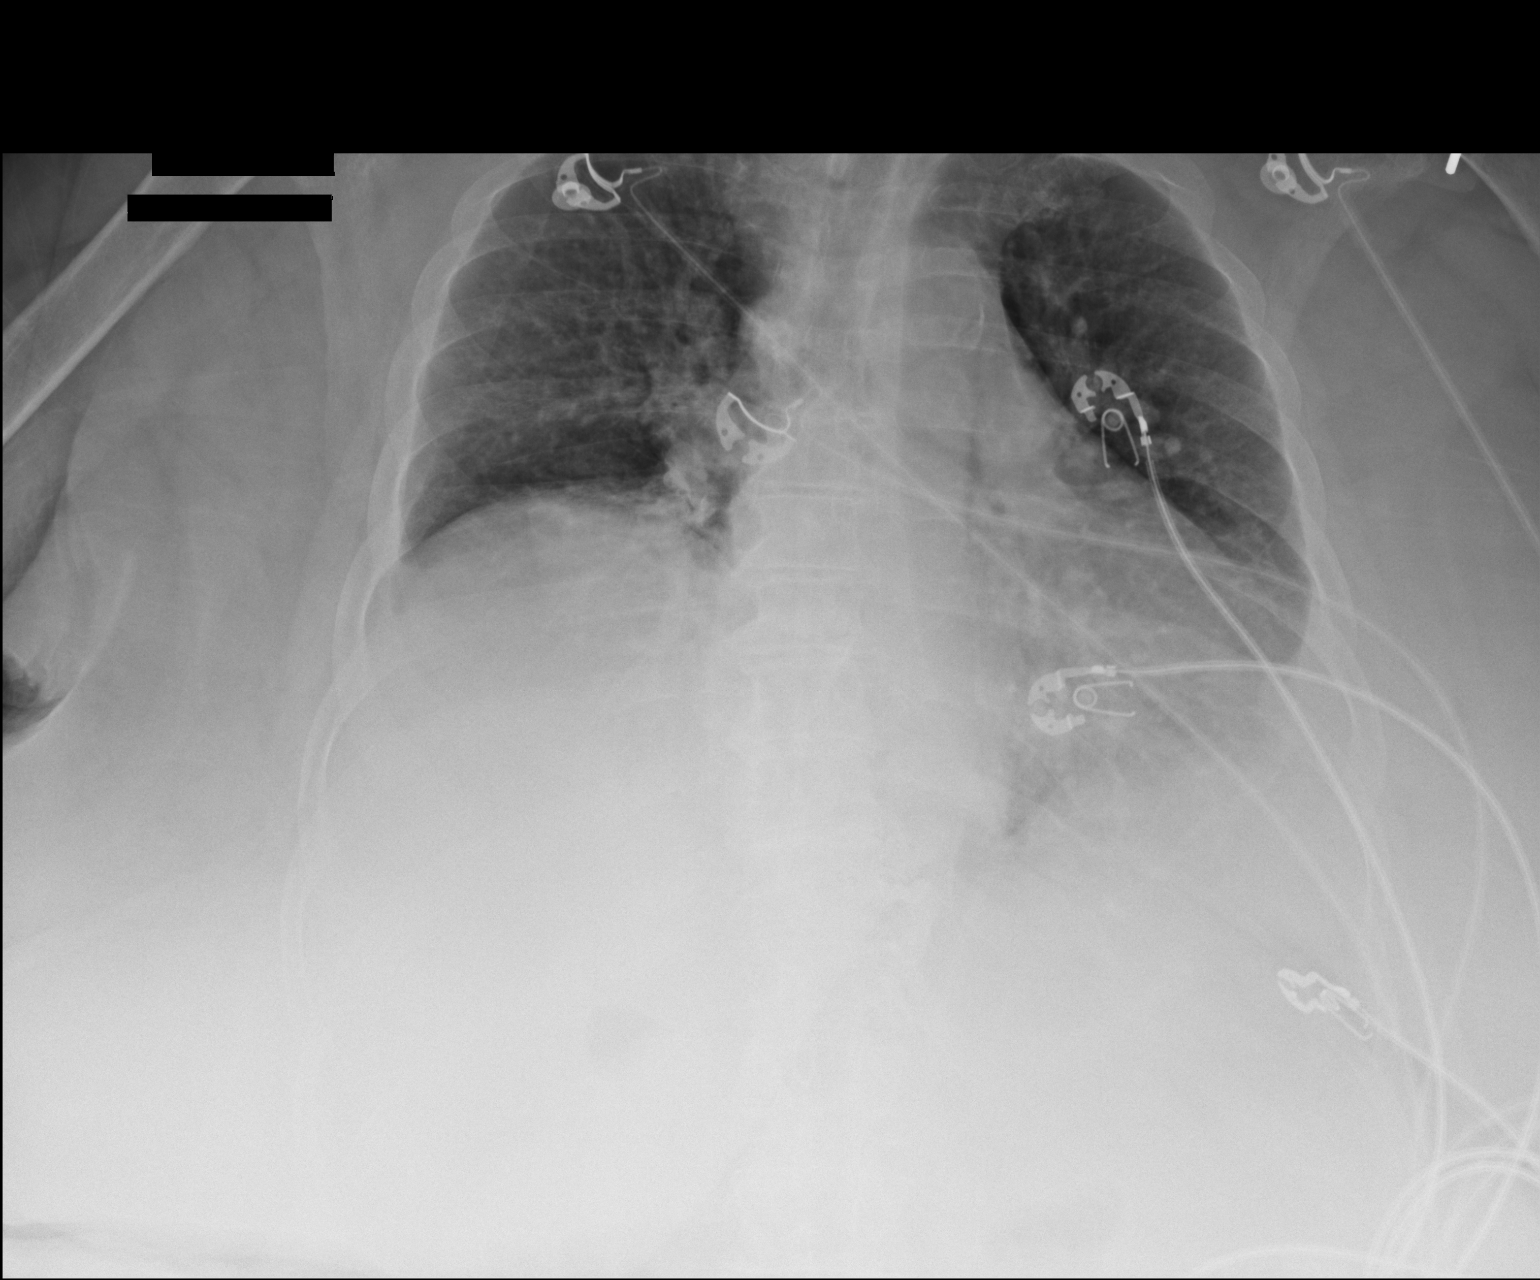

[1 of 1 positions shown; findings below may reference images not displayed]

FINDINGS: Cardiac shadow is at the upper limits of normal in size. Mild aortic
calcifications are seen. Elevation of the right hemidiaphragm is
noted. No focal infiltrate or sizable effusion is seen. No acute
bony abnormality is noted.
IMPRESSION: No acute abnormality noted.
